# Patient Record
Sex: Female | Born: 1935 | Race: White | Hispanic: No | Smoking: Former smoker
Health system: Southern US, Community
[De-identification: ages and names within clinical notes are randomized; demographics above are authoritative.]

## PROBLEM LIST (undated history)

## (undated) DIAGNOSIS — K219 Gastro-esophageal reflux disease without esophagitis: Secondary | ICD-10-CM

## (undated) DIAGNOSIS — I10 Essential (primary) hypertension: Secondary | ICD-10-CM

## (undated) DIAGNOSIS — N182 Chronic kidney disease, stage 2 (mild): Secondary | ICD-10-CM

## (undated) DIAGNOSIS — M1990 Unspecified osteoarthritis, unspecified site: Secondary | ICD-10-CM

## (undated) DIAGNOSIS — J449 Chronic obstructive pulmonary disease, unspecified: Secondary | ICD-10-CM

## (undated) DIAGNOSIS — R202 Paresthesia of skin: Secondary | ICD-10-CM

## (undated) DIAGNOSIS — R0602 Shortness of breath: Secondary | ICD-10-CM

## (undated) DIAGNOSIS — G709 Myoneural disorder, unspecified: Secondary | ICD-10-CM

## (undated) HISTORY — PX: OTHER SURGICAL HISTORY: SHX169

## (undated) HISTORY — PX: JOINT REPLACEMENT: SHX530

---

## 2008-05-02 ENCOUNTER — Encounter: Admission: RE | Admit: 2008-05-02 | Discharge: 2008-05-02 | Payer: Self-pay | Admitting: Orthopaedic Surgery

## 2008-05-14 ENCOUNTER — Ambulatory Visit: Payer: Self-pay | Admitting: *Deleted

## 2008-05-14 ENCOUNTER — Ambulatory Visit (HOSPITAL_COMMUNITY): Admission: RE | Admit: 2008-05-14 | Discharge: 2008-05-14 | Payer: Self-pay | Admitting: Orthopaedic Surgery

## 2008-05-14 ENCOUNTER — Encounter (INDEPENDENT_AMBULATORY_CARE_PROVIDER_SITE_OTHER): Payer: Self-pay | Admitting: Orthopaedic Surgery

## 2008-10-14 ENCOUNTER — Inpatient Hospital Stay (HOSPITAL_COMMUNITY): Admission: RE | Admit: 2008-10-14 | Discharge: 2008-10-20 | Payer: Self-pay | Admitting: Orthopaedic Surgery

## 2008-10-31 ENCOUNTER — Inpatient Hospital Stay (HOSPITAL_COMMUNITY): Admission: RE | Admit: 2008-10-31 | Discharge: 2008-11-04 | Payer: Self-pay | Admitting: Orthopaedic Surgery

## 2008-11-27 ENCOUNTER — Encounter (INDEPENDENT_AMBULATORY_CARE_PROVIDER_SITE_OTHER): Payer: Self-pay | Admitting: Internal Medicine

## 2008-11-27 ENCOUNTER — Ambulatory Visit: Payer: Self-pay | Admitting: Infectious Disease

## 2008-11-27 ENCOUNTER — Inpatient Hospital Stay (HOSPITAL_COMMUNITY): Admission: EM | Admit: 2008-11-27 | Discharge: 2008-12-16 | Payer: Self-pay | Admitting: Emergency Medicine

## 2008-11-27 ENCOUNTER — Ambulatory Visit: Payer: Self-pay | Admitting: Internal Medicine

## 2008-12-12 ENCOUNTER — Encounter (INDEPENDENT_AMBULATORY_CARE_PROVIDER_SITE_OTHER): Payer: Self-pay | Admitting: Internal Medicine

## 2009-05-19 ENCOUNTER — Inpatient Hospital Stay (HOSPITAL_COMMUNITY): Admission: RE | Admit: 2009-05-19 | Discharge: 2009-05-21 | Payer: Self-pay | Admitting: Orthopaedic Surgery

## 2010-09-21 LAB — C-REACTIVE PROTEIN
CRP: 0.9 mg/dL — ABNORMAL HIGH (ref <0.6)
CRP: 2 mg/dL — ABNORMAL HIGH (ref <0.6)

## 2010-09-21 LAB — BASIC METABOLIC PANEL
BUN: 12 mg/dL (ref 6–23)
Calcium: 8.9 mg/dL (ref 8.4–10.5)
Chloride: 104 mEq/L (ref 96–112)
Chloride: 105 mEq/L (ref 96–112)
Creatinine, Ser: 0.74 mg/dL (ref 0.4–1.2)
Creatinine, Ser: 0.74 mg/dL (ref 0.4–1.2)
GFR calc non Af Amer: >60 mL/min (ref >60)
Glucose, Bld: 77 mg/dL (ref 70–99)
Sodium: 137 mEq/L (ref 135–145)

## 2010-09-21 LAB — CBC
RDW: 19.3 % — ABNORMAL HIGH (ref 11.5–15.5)
RDW: 19.4 % — ABNORMAL HIGH (ref 11.5–15.5)
WBC: 4.4 10*3/uL (ref 4.0–10.5)

## 2010-09-21 LAB — SEDIMENTATION RATE: Sed Rate: 22 mm/hr (ref 0–22)

## 2010-09-22 LAB — BASIC METABOLIC PANEL
BUN: 22 mg/dL (ref 6–23)
CO2: 26 mEq/L (ref 19–32)
Calcium: 9.3 mg/dL (ref 8.4–10.5)
Chloride: 99 mEq/L (ref 96–112)
Creatinine, Ser: 1.11 mg/dL (ref 0.4–1.2)
Glucose, Bld: 74 mg/dL (ref 70–99)
Potassium: 5.3 mEq/L — ABNORMAL HIGH (ref 3.5–5.1)
Sodium: 134 mEq/L — ABNORMAL LOW (ref 135–145)

## 2010-09-22 LAB — ABO/RH: ABO/RH(D): A POS

## 2010-09-22 LAB — TYPE AND SCREEN: Antibody Screen: NEGATIVE

## 2010-09-22 LAB — DIFFERENTIAL
Basophils Absolute: 0 10*3/uL (ref 0.0–0.1)
Basophils Relative: 1 % (ref 0–1)
Eosinophils Absolute: 0 10*3/uL (ref 0.0–0.7)
Monocytes Absolute: 0.4 10*3/uL (ref 0.1–1.0)
Monocytes Relative: 6 % (ref 3–12)
Neutrophils Relative %: 74 % (ref 43–77)

## 2010-09-22 LAB — URINALYSIS, ROUTINE W REFLEX MICROSCOPIC
Bilirubin Urine: NEGATIVE
Glucose, UA: NEGATIVE mg/dL
Ketones, ur: NEGATIVE mg/dL
pH: 6.5 (ref 5.0–8.0)

## 2010-09-22 LAB — CBC
HCT: 31.9 % — ABNORMAL LOW (ref 36.0–46.0)
Platelets: 281 10*3/uL (ref 150–400)
RDW: 18.5 % — ABNORMAL HIGH (ref 11.5–15.5)
WBC: 6.3 10*3/uL (ref 4.0–10.5)

## 2010-09-22 LAB — URINE MICROSCOPIC-ADD ON

## 2010-09-27 LAB — BASIC METABOLIC PANEL
BUN: 10 mg/dL (ref 6–23)
BUN: 11 mg/dL (ref 6–23)
BUN: 11 mg/dL (ref 6–23)
BUN: 11 mg/dL (ref 6–23)
BUN: 13 mg/dL (ref 6–23)
BUN: 4 mg/dL — ABNORMAL LOW (ref 6–23)
BUN: 6 mg/dL (ref 6–23)
BUN: 7 mg/dL (ref 6–23)
BUN: 22 mg/dL (ref 6–23)
BUN: 34 mg/dL — ABNORMAL HIGH (ref 6–23)
BUN: 47 mg/dL — ABNORMAL HIGH (ref 6–23)
CO2: 26 mEq/L (ref 19–32)
CO2: 26 mEq/L (ref 19–32)
CO2: 26 mEq/L (ref 19–32)
CO2: 32 mEq/L (ref 19–32)
CO2: 35 mEq/L — ABNORMAL HIGH (ref 19–32)
CO2: 20 mEq/L (ref 19–32)
CO2: 23 mEq/L (ref 19–32)
Calcium: 8 mg/dL — ABNORMAL LOW (ref 8.4–10.5)
Calcium: 8 mg/dL — ABNORMAL LOW (ref 8.4–10.5)
Calcium: 8.3 mg/dL — ABNORMAL LOW (ref 8.4–10.5)
Calcium: 9.1 mg/dL (ref 8.4–10.5)
Calcium: 8.5 mg/dL (ref 8.4–10.5)
Calcium: 8.5 mg/dL (ref 8.4–10.5)
Calcium: 9.1 mg/dL (ref 8.4–10.5)
Calcium: 9.1 mg/dL (ref 8.4–10.5)
Chloride: 102 mEq/L (ref 96–112)
Chloride: 104 mEq/L (ref 96–112)
Chloride: 107 mEq/L (ref 96–112)
Chloride: 107 mEq/L (ref 96–112)
Chloride: 108 mEq/L (ref 96–112)
Chloride: 108 mEq/L (ref 96–112)
Chloride: 109 mEq/L (ref 96–112)
Chloride: 98 mEq/L (ref 96–112)
Chloride: 111 mEq/L (ref 96–112)
Creatinine, Ser: 0.61 mg/dL (ref 0.4–1.2)
Creatinine, Ser: 0.61 mg/dL (ref 0.4–1.2)
Creatinine, Ser: 0.64 mg/dL (ref 0.4–1.2)
Creatinine, Ser: 0.67 mg/dL (ref 0.4–1.2)
Creatinine, Ser: 0.69 mg/dL (ref 0.4–1.2)
Creatinine, Ser: 0.75 mg/dL (ref 0.4–1.2)
Creatinine, Ser: 0.76 mg/dL (ref 0.4–1.2)
Creatinine, Ser: 0.79 mg/dL (ref 0.4–1.2)
Creatinine, Ser: 0.72 mg/dL (ref 0.4–1.2)
Creatinine, Ser: 0.79 mg/dL (ref 0.4–1.2)
Creatinine, Ser: 0.97 mg/dL (ref 0.4–1.2)
GFR calc Af Amer: >60 mL/min (ref >60)
GFR calc Af Amer: >60 mL/min (ref >60)
GFR calc Af Amer: >60 mL/min (ref >60)
GFR calc Af Amer: >60 mL/min (ref >60)
GFR calc Af Amer: >60 mL/min (ref >60)
GFR calc Af Amer: >60 mL/min (ref >60)
GFR calc Af Amer: 38 mL/min — ABNORMAL LOW (ref >60)
GFR calc Af Amer: >60 mL/min (ref >60)
GFR calc Af Amer: >60 mL/min (ref >60)
GFR calc Af Amer: >60 mL/min (ref >60)
GFR calc non Af Amer: >60 mL/min (ref >60)
GFR calc non Af Amer: >60 mL/min (ref >60)
GFR calc non Af Amer: >60 mL/min (ref >60)
GFR calc non Af Amer: >60 mL/min (ref >60)
GFR calc non Af Amer: >60 mL/min (ref >60)
GFR calc non Af Amer: >60 mL/min (ref >60)
GFR calc non Af Amer: >60 mL/min (ref >60)
GFR calc non Af Amer: >60 mL/min (ref >60)
GFR calc non Af Amer: >60 mL/min (ref >60)
GFR calc non Af Amer: 31 mL/min — ABNORMAL LOW (ref >60)
GFR calc non Af Amer: 55 mL/min — ABNORMAL LOW (ref >60)
Glucose, Bld: 115 mg/dL — ABNORMAL HIGH (ref 70–99)
Glucose, Bld: 116 mg/dL — ABNORMAL HIGH (ref 70–99)
Glucose, Bld: 143 mg/dL — ABNORMAL HIGH (ref 70–99)
Glucose, Bld: 86 mg/dL (ref 70–99)
Glucose, Bld: 86 mg/dL (ref 70–99)
Glucose, Bld: 91 mg/dL (ref 70–99)
Glucose, Bld: 92 mg/dL (ref 70–99)
Glucose, Bld: 74 mg/dL (ref 70–99)
Glucose, Bld: 88 mg/dL (ref 70–99)
Potassium: 3.4 mEq/L — ABNORMAL LOW (ref 3.5–5.1)
Potassium: 3.5 mEq/L (ref 3.5–5.1)
Potassium: 3.7 mEq/L (ref 3.5–5.1)
Potassium: 3.7 mEq/L (ref 3.5–5.1)
Potassium: 3.7 mEq/L (ref 3.5–5.1)
Potassium: 3.8 mEq/L (ref 3.5–5.1)
Potassium: 4.3 mEq/L (ref 3.5–5.1)
Potassium: 3.3 mEq/L — ABNORMAL LOW (ref 3.5–5.1)
Potassium: 4.2 mEq/L (ref 3.5–5.1)
Sodium: 137 mEq/L (ref 135–145)
Sodium: 137 mEq/L (ref 135–145)
Sodium: 138 mEq/L (ref 135–145)
Sodium: 139 mEq/L (ref 135–145)
Sodium: 131 mEq/L — ABNORMAL LOW (ref 135–145)
Sodium: 133 mEq/L — ABNORMAL LOW (ref 135–145)
Sodium: 138 mEq/L (ref 135–145)
Sodium: 139 mEq/L (ref 135–145)

## 2010-09-27 LAB — CBC
HCT: 27.7 % — ABNORMAL LOW (ref 36.0–46.0)
HCT: 27.9 % — ABNORMAL LOW (ref 36.0–46.0)
HCT: 29.4 % — ABNORMAL LOW (ref 36.0–46.0)
HCT: 30.2 % — ABNORMAL LOW (ref 36.0–46.0)
HCT: 30.6 % — ABNORMAL LOW (ref 36.0–46.0)
HCT: 30.7 % — ABNORMAL LOW (ref 36.0–46.0)
HCT: 31 % — ABNORMAL LOW (ref 36.0–46.0)
HCT: 32.2 % — ABNORMAL LOW (ref 36.0–46.0)
HCT: 32.6 % — ABNORMAL LOW (ref 36.0–46.0)
HCT: 32.7 % — ABNORMAL LOW (ref 36.0–46.0)
HCT: 33.8 % — ABNORMAL LOW (ref 36.0–46.0)
HCT: 34.7 % — ABNORMAL LOW (ref 36.0–46.0)
HCT: 33.5 % — ABNORMAL LOW (ref 36.0–46.0)
HCT: 34.8 % — ABNORMAL LOW (ref 36.0–46.0)
HCT: 29.3 % — ABNORMAL LOW (ref 36.0–46.0)
HCT: 31.9 % — ABNORMAL LOW (ref 36.0–46.0)
Hemoglobin: 10 g/dL — ABNORMAL LOW (ref 12.0–15.0)
Hemoglobin: 10.1 g/dL — ABNORMAL LOW (ref 12.0–15.0)
Hemoglobin: 10.1 g/dL — ABNORMAL LOW (ref 12.0–15.0)
Hemoglobin: 10.6 g/dL — ABNORMAL LOW (ref 12.0–15.0)
Hemoglobin: 11.1 g/dL — ABNORMAL LOW (ref 12.0–15.0)
Hemoglobin: 11.3 g/dL — ABNORMAL LOW (ref 12.0–15.0)
Hemoglobin: 9.8 g/dL — ABNORMAL LOW (ref 12.0–15.0)
Hemoglobin: 9.9 g/dL — ABNORMAL LOW (ref 12.0–15.0)
Hemoglobin: 11.8 g/dL — ABNORMAL LOW (ref 12.0–15.0)
Hemoglobin: 10.1 g/dL — ABNORMAL LOW (ref 12.0–15.0)
Hemoglobin: 10.4 g/dL — ABNORMAL LOW (ref 12.0–15.0)
Hemoglobin: 10.7 g/dL — ABNORMAL LOW (ref 12.0–15.0)
Hemoglobin: 9.8 g/dL — ABNORMAL LOW (ref 12.0–15.0)
MCHC: 31.6 g/dL (ref 30.0–36.0)
MCHC: 32.4 g/dL (ref 30.0–36.0)
MCHC: 32.6 g/dL (ref 30.0–36.0)
MCHC: 32.8 g/dL (ref 30.0–36.0)
MCHC: 32.8 g/dL (ref 30.0–36.0)
MCHC: 32.9 g/dL (ref 30.0–36.0)
MCHC: 33 g/dL (ref 30.0–36.0)
MCHC: 33 g/dL (ref 30.0–36.0)
MCHC: 32.7 g/dL (ref 30.0–36.0)
MCHC: 32.1 g/dL (ref 30.0–36.0)
MCHC: 32.3 g/dL (ref 30.0–36.0)
MCHC: 33.4 g/dL (ref 30.0–36.0)
MCV: 84.2 fL (ref 78.0–100.0)
MCV: 84.3 fL (ref 78.0–100.0)
MCV: 84.9 fL (ref 78.0–100.0)
MCV: 85 fL (ref 78.0–100.0)
MCV: 85 fL (ref 78.0–100.0)
MCV: 85.4 fL (ref 78.0–100.0)
MCV: 85.8 fL (ref 78.0–100.0)
MCV: 86 fL (ref 78.0–100.0)
MCV: 86.6 fL (ref 78.0–100.0)
MCV: 86.8 fL (ref 78.0–100.0)
MCV: 87.3 fL (ref 78.0–100.0)
MCV: 84.9 fL (ref 78.0–100.0)
MCV: 85.9 fL (ref 78.0–100.0)
MCV: 85.2 fL (ref 78.0–100.0)
Platelets: 368 10*3/uL (ref 150–400)
Platelets: 398 10*3/uL (ref 150–400)
Platelets: 437 10*3/uL — ABNORMAL HIGH (ref 150–400)
Platelets: 449 10*3/uL — ABNORMAL HIGH (ref 150–400)
Platelets: 488 10*3/uL — ABNORMAL HIGH (ref 150–400)
Platelets: 505 10*3/uL — ABNORMAL HIGH (ref 150–400)
Platelets: 552 10*3/uL — ABNORMAL HIGH (ref 150–400)
Platelets: 561 10*3/uL — ABNORMAL HIGH (ref 150–400)
Platelets: 587 10*3/uL — ABNORMAL HIGH (ref 150–400)
Platelets: 569 10*3/uL — ABNORMAL HIGH (ref 150–400)
Platelets: 421 10*3/uL — ABNORMAL HIGH (ref 150–400)
Platelets: 434 10*3/uL — ABNORMAL HIGH (ref 150–400)
Platelets: 510 10*3/uL — ABNORMAL HIGH (ref 150–400)
RBC: 3.31 MIL/uL — ABNORMAL LOW (ref 3.87–5.11)
RBC: 3.44 MIL/uL — ABNORMAL LOW (ref 3.87–5.11)
RBC: 3.5 MIL/uL — ABNORMAL LOW (ref 3.87–5.11)
RBC: 3.51 MIL/uL — ABNORMAL LOW (ref 3.87–5.11)
RBC: 3.57 MIL/uL — ABNORMAL LOW (ref 3.87–5.11)
RBC: 3.59 MIL/uL — ABNORMAL LOW (ref 3.87–5.11)
RBC: 3.69 MIL/uL — ABNORMAL LOW (ref 3.87–5.11)
RBC: 3.77 MIL/uL — ABNORMAL LOW (ref 3.87–5.11)
RBC: 3.82 MIL/uL — ABNORMAL LOW (ref 3.87–5.11)
RBC: 3.9 MIL/uL (ref 3.87–5.11)
RBC: 3.99 MIL/uL (ref 3.87–5.11)
RBC: 3.44 MIL/uL — ABNORMAL LOW (ref 3.87–5.11)
RBC: 3.5 MIL/uL — ABNORMAL LOW (ref 3.87–5.11)
RBC: 3.9 MIL/uL (ref 3.87–5.11)
RDW: 17.2 % — ABNORMAL HIGH (ref 11.5–15.5)
RDW: 18.2 % — ABNORMAL HIGH (ref 11.5–15.5)
RDW: 18.5 % — ABNORMAL HIGH (ref 11.5–15.5)
RDW: 18.7 % — ABNORMAL HIGH (ref 11.5–15.5)
RDW: 18.8 % — ABNORMAL HIGH (ref 11.5–15.5)
RDW: 18.9 % — ABNORMAL HIGH (ref 11.5–15.5)
RDW: 19.2 % — ABNORMAL HIGH (ref 11.5–15.5)
RDW: 19.5 % — ABNORMAL HIGH (ref 11.5–15.5)
RDW: 19.8 % — ABNORMAL HIGH (ref 11.5–15.5)
RDW: 16.9 % — ABNORMAL HIGH (ref 11.5–15.5)
RDW: 17.4 % — ABNORMAL HIGH (ref 11.5–15.5)
WBC: 13.5 10*3/uL — ABNORMAL HIGH (ref 4.0–10.5)
WBC: 13.8 10*3/uL — ABNORMAL HIGH (ref 4.0–10.5)
WBC: 15 10*3/uL — ABNORMAL HIGH (ref 4.0–10.5)
WBC: 15.9 10*3/uL — ABNORMAL HIGH (ref 4.0–10.5)
WBC: 16.3 10*3/uL — ABNORMAL HIGH (ref 4.0–10.5)
WBC: 16.7 10*3/uL — ABNORMAL HIGH (ref 4.0–10.5)
WBC: 17.6 10*3/uL — ABNORMAL HIGH (ref 4.0–10.5)
WBC: 18.6 10*3/uL — ABNORMAL HIGH (ref 4.0–10.5)
WBC: 21.3 10*3/uL — ABNORMAL HIGH (ref 4.0–10.5)
WBC: 9 10*3/uL (ref 4.0–10.5)
WBC: 9.7 10*3/uL (ref 4.0–10.5)
WBC: 9.9 10*3/uL (ref 4.0–10.5)
WBC: 55.3 10*3/uL (ref 4.0–10.5)
WBC: 61 10*3/uL (ref 4.0–10.5)
WBC: 21.7 10*3/uL — ABNORMAL HIGH (ref 4.0–10.5)
WBC: 23.5 10*3/uL — ABNORMAL HIGH (ref 4.0–10.5)
WBC: 41.8 10*3/uL — ABNORMAL HIGH (ref 4.0–10.5)

## 2010-09-27 LAB — COMPREHENSIVE METABOLIC PANEL
Albumin: 1.6 g/dL — ABNORMAL LOW (ref 3.5–5.2)
Albumin: 2.1 g/dL — ABNORMAL LOW (ref 3.5–5.2)
Alkaline Phosphatase: 136 U/L — ABNORMAL HIGH (ref 39–117)
BUN: 6 mg/dL (ref 6–23)
BUN: 57 mg/dL — ABNORMAL HIGH (ref 6–23)
BUN: 58 mg/dL — ABNORMAL HIGH (ref 6–23)
CO2: 21 mEq/L (ref 19–32)
Calcium: 8.3 mg/dL — ABNORMAL LOW (ref 8.4–10.5)
Chloride: 100 mEq/L (ref 96–112)
Chloride: 99 mEq/L (ref 96–112)
Creatinine, Ser: 0.59 mg/dL (ref 0.4–1.2)
Creatinine, Ser: 2.52 mg/dL — ABNORMAL HIGH (ref 0.4–1.2)
GFR calc non Af Amer: 18 mL/min — ABNORMAL LOW (ref >60)
GFR calc non Af Amer: 18 mL/min — ABNORMAL LOW (ref >60)
Glucose, Bld: 92 mg/dL (ref 70–99)
Glucose, Bld: 121 mg/dL — ABNORMAL HIGH (ref 70–99)
Potassium: 5.5 mEq/L — ABNORMAL HIGH (ref 3.5–5.1)
Total Bilirubin: 0.5 mg/dL (ref 0.3–1.2)
Total Bilirubin: 0.5 mg/dL (ref 0.3–1.2)
Total Bilirubin: 0.6 mg/dL (ref 0.3–1.2)
Total Protein: 4.4 g/dL — ABNORMAL LOW (ref 6.0–8.3)

## 2010-09-27 LAB — DIFFERENTIAL
Basophils Absolute: 0.1 10*3/uL (ref 0.0–0.1)
Basophils Absolute: 0 10*3/uL (ref 0.0–0.1)
Basophils Absolute: 0 10*3/uL (ref 0.0–0.1)
Basophils Absolute: 0 10*3/uL (ref 0.0–0.1)
Basophils Absolute: 0 10*3/uL (ref 0.0–0.1)
Basophils Relative: 0 % (ref 0–1)
Basophils Relative: 0 % (ref 0–1)
Basophils Relative: 0 % (ref 0–1)
Eosinophils Absolute: 0 10*3/uL (ref 0.0–0.7)
Eosinophils Absolute: 0 10*3/uL (ref 0.0–0.7)
Eosinophils Absolute: 0 10*3/uL (ref 0.0–0.7)
Eosinophils Absolute: 0 10*3/uL (ref 0.0–0.7)
Eosinophils Absolute: 0 10*3/uL (ref 0.0–0.7)
Eosinophils Relative: 0 % (ref 0–5)
Eosinophils Relative: 0 % (ref 0–5)
Lymphocytes Relative: 3 % — ABNORMAL LOW (ref 12–46)
Lymphocytes Relative: 3 % — ABNORMAL LOW (ref 12–46)
Lymphs Abs: 1.8 10*3/uL (ref 0.7–4.0)
Lymphs Abs: 2.4 10*3/uL (ref 0.7–4.0)
Lymphs Abs: 1.5 10*3/uL (ref 0.7–4.0)
Monocytes Absolute: 1.8 10*3/uL — ABNORMAL HIGH (ref 0.1–1.0)
Monocytes Absolute: 1.1 10*3/uL — ABNORMAL HIGH (ref 0.1–1.0)
Monocytes Absolute: 1.3 10*3/uL — ABNORMAL HIGH (ref 0.1–1.0)
Monocytes Relative: 3 % (ref 3–12)
Monocytes Relative: 4 % (ref 3–12)
Monocytes Relative: 6 % (ref 3–12)
Monocytes Relative: 8 % (ref 3–12)
Neutro Abs: 18.6 10*3/uL — ABNORMAL HIGH (ref 1.7–7.7)
Neutro Abs: 38.8 10*3/uL — ABNORMAL HIGH (ref 1.7–7.7)
Neutrophils Relative %: 86 % — ABNORMAL HIGH (ref 43–77)
Neutrophils Relative %: 86 % — ABNORMAL HIGH (ref 43–77)
Neutrophils Relative %: 91 % — ABNORMAL HIGH (ref 43–77)
Neutrophils Relative %: 94 % — ABNORMAL HIGH (ref 43–77)

## 2010-09-27 LAB — GASTRIC OCCULT BLOOD (1-CARD TO LAB)

## 2010-09-27 LAB — URINALYSIS, ROUTINE W REFLEX MICROSCOPIC
Glucose, UA: NEGATIVE mg/dL
Ketones, ur: 15 mg/dL — AB
Protein, ur: 30 mg/dL — AB

## 2010-09-27 LAB — CLOSTRIDIUM DIFFICILE EIA

## 2010-09-27 LAB — PHOSPHORUS
Phosphorus: 3.1 mg/dL (ref 2.3–4.6)
Phosphorus: 5 mg/dL — ABNORMAL HIGH (ref 2.3–4.6)

## 2010-09-27 LAB — VANCOMYCIN, TROUGH: Vancomycin Tr: 18.3 ug/mL (ref 10.0–20.0)

## 2010-09-27 LAB — GIARDIA/CRYPTOSPORIDIUM SCREEN(EIA)
Cryptosporidium Screen (EIA): NEGATIVE
Giardia Screen - EIA: NEGATIVE

## 2010-09-27 LAB — TSH: TSH: 3.161 u[IU]/mL (ref 0.350–4.500)

## 2010-09-27 LAB — CROSSMATCH

## 2010-09-27 LAB — CULTURE, BLOOD (ROUTINE X 2): Culture: NO GROWTH

## 2010-09-27 LAB — GLUCOSE, CAPILLARY: Glucose-Capillary: 124 mg/dL — ABNORMAL HIGH (ref 70–99)

## 2010-09-27 LAB — MAGNESIUM: Magnesium: 1.9 mg/dL (ref 1.5–2.5)

## 2010-09-27 LAB — URINE MICROSCOPIC-ADD ON

## 2010-09-27 LAB — HEMOGLOBIN AND HEMATOCRIT, BLOOD: HCT: 25.7 % — ABNORMAL LOW (ref 36.0–46.0)

## 2010-09-27 LAB — VANCOMYCIN, RANDOM: Vancomycin Rm: <5 ug/mL

## 2010-09-28 LAB — CBC
HCT: 29 % — ABNORMAL LOW (ref 36.0–46.0)
HCT: 29.1 % — ABNORMAL LOW (ref 36.0–46.0)
Hemoglobin: 9.5 g/dL — ABNORMAL LOW (ref 12.0–15.0)
MCHC: 32.7 g/dL (ref 30.0–36.0)
MCV: 85.8 fL (ref 78.0–100.0)
MCV: 86.2 fL (ref 78.0–100.0)
Platelets: 266 10*3/uL (ref 150–400)
Platelets: 368 10*3/uL (ref 150–400)
Platelets: 470 10*3/uL — ABNORMAL HIGH (ref 150–400)
RBC: 3.4 MIL/uL — ABNORMAL LOW (ref 3.87–5.11)
RBC: 3.91 MIL/uL (ref 3.87–5.11)
RDW: 15.9 % — ABNORMAL HIGH (ref 11.5–15.5)
RDW: 16.1 % — ABNORMAL HIGH (ref 11.5–15.5)
WBC: 5.9 10*3/uL (ref 4.0–10.5)
WBC: 6.7 10*3/uL (ref 4.0–10.5)
WBC: 6.8 10*3/uL (ref 4.0–10.5)

## 2010-09-28 LAB — PROTIME-INR
INR: 1.1 (ref 0.00–1.49)
Prothrombin Time: 14 seconds (ref 11.6–15.2)

## 2010-09-28 LAB — BASIC METABOLIC PANEL
BUN: 13 mg/dL (ref 6–23)
BUN: 17 mg/dL (ref 6–23)
Chloride: 95 mEq/L — ABNORMAL LOW (ref 96–112)
Creatinine, Ser: 0.65 mg/dL (ref 0.4–1.2)
Creatinine, Ser: 1.26 mg/dL — ABNORMAL HIGH (ref 0.4–1.2)
GFR calc Af Amer: 49 mL/min — ABNORMAL LOW (ref >60)
GFR calc non Af Amer: 41 mL/min — ABNORMAL LOW (ref >60)
GFR calc non Af Amer: >60 mL/min (ref >60)
Glucose, Bld: 85 mg/dL (ref 70–99)
Potassium: 3.8 mEq/L (ref 3.5–5.1)
Potassium: 4.1 mEq/L (ref 3.5–5.1)

## 2010-09-28 LAB — DIFFERENTIAL
Lymphocytes Relative: 29 % (ref 12–46)
Lymphs Abs: 1.9 10*3/uL (ref 0.7–4.0)
Monocytes Relative: 7 % (ref 3–12)
Neutrophils Relative %: 65 % (ref 43–77)

## 2010-09-28 LAB — SEDIMENTATION RATE: Sed Rate: 18 mm/hr (ref 0–22)

## 2010-09-28 LAB — C-REACTIVE PROTEIN: CRP: 3.2 mg/dL — ABNORMAL HIGH (ref <0.6)

## 2010-09-28 LAB — APTT: aPTT: 32 seconds (ref 24–37)

## 2010-09-29 LAB — BASIC METABOLIC PANEL WITH GFR
BUN: 11 mg/dL (ref 6–23)
CO2: 32 meq/L (ref 19–32)
Calcium: 8.8 mg/dL (ref 8.4–10.5)
Chloride: 92 meq/L — ABNORMAL LOW (ref 96–112)
Creatinine, Ser: 0.68 mg/dL (ref 0.4–1.2)
GFR calc non Af Amer: >60 mL/min
Glucose, Bld: 74 mg/dL (ref 70–99)
Potassium: 3.3 meq/L — ABNORMAL LOW (ref 3.5–5.1)
Sodium: 132 meq/L — ABNORMAL LOW (ref 135–145)

## 2010-09-29 LAB — ANAEROBIC CULTURE

## 2010-09-29 LAB — BODY FLUID CULTURE

## 2010-09-29 LAB — CBC
HCT: 34.8 % — ABNORMAL LOW (ref 36.0–46.0)
Hemoglobin: 10.1 g/dL — ABNORMAL LOW (ref 12.0–15.0)
Hemoglobin: 11.6 g/dL — ABNORMAL LOW (ref 12.0–15.0)
MCHC: 32.8 g/dL (ref 30.0–36.0)
MCHC: 33.2 g/dL (ref 30.0–36.0)
Platelets: 261 10*3/uL (ref 150–400)
Platelets: 355 10*3/uL (ref 150–400)
RBC: 3.56 MIL/uL — ABNORMAL LOW (ref 3.87–5.11)
RBC: 4.09 MIL/uL (ref 3.87–5.11)
RDW: 16.5 % — ABNORMAL HIGH (ref 11.5–15.5)
WBC: 7 10*3/uL (ref 4.0–10.5)

## 2010-09-29 LAB — BASIC METABOLIC PANEL
BUN: 10 mg/dL (ref 6–23)
BUN: 14 mg/dL (ref 6–23)
CO2: 32 mEq/L (ref 19–32)
Calcium: 8.9 mg/dL (ref 8.4–10.5)
Creatinine, Ser: 0.7 mg/dL (ref 0.4–1.2)
GFR calc Af Amer: >60 mL/min (ref >60)
GFR calc Af Amer: >60 mL/min (ref >60)
GFR calc non Af Amer: >60 mL/min (ref >60)
Potassium: 4.2 mEq/L (ref 3.5–5.1)
Sodium: 132 mEq/L — ABNORMAL LOW (ref 135–145)

## 2010-09-29 LAB — TISSUE CULTURE

## 2010-09-29 LAB — PROTIME-INR: Prothrombin Time: 14.1 seconds (ref 11.6–15.2)

## 2010-09-29 LAB — DIFFERENTIAL
Eosinophils Relative: 0 % (ref 0–5)
Lymphocytes Relative: 24 % (ref 12–46)
Lymphs Abs: 1.7 10*3/uL (ref 0.7–4.0)
Monocytes Relative: 7 % (ref 3–12)

## 2010-09-29 LAB — TYPE AND SCREEN
ABO/RH(D): A POS
Antibody Screen: NEGATIVE

## 2010-09-29 LAB — CK: Total CK: 49 U/L (ref 7–177)

## 2010-09-29 LAB — SEDIMENTATION RATE
Sed Rate: 16 mm/h (ref 0–22)
Sed Rate: 17 mm/hr (ref 0–22)

## 2010-09-29 LAB — C-REACTIVE PROTEIN: CRP: 7.9 mg/dL — ABNORMAL HIGH (ref <0.6)

## 2010-11-02 NOTE — Discharge Summary (Signed)
NAME:  Bridget Duncan, Bridget Duncan NO.:  [id number]   MEDICAL RECORD NO.:  [id number]          PATIENT TYPE:  INP   LOCATION:  5006                         FACILITY:  [facility]   PHYSICIAN:  Vanita Panda. Magnus Ivan, M.D.DATE OF BIRTH:  July 14, 1925   DATE OF ADMISSION:  10/14/2008  DATE OF DISCHARGE:  10/20/2008                               DISCHARGE SUMMARY   ADMITTING DIAGNOSIS:  Right hip prosthetic infection.   POSTOPERATIVE DIAGNOSIS:  Right hip prosthetic infection.   PROCEDURES:  Irrigation and debridement of right hip superficial and  deep tissue with placement of antibiotic beads on October 14, 2008.   HOSPITAL COURSE:  Ms. Waugh is an 77 year old female who has had  previous 8 surgeries involving her right hip.  She started out with a  hip fracture many years ago and this was treated all in [place],  [place].  According to the family,  she had a series of infections of right hip and even went around 16  months without any type of hardware in her hip.  She previously  underwent a total hip replacement about 4-5 years ago with a constrained  liner and a large stem.  She had been doing well until followup in my  office last week.  She was noted to have pain in her hip incision and  the area that I needed to open up because it was fluctuant.  I ended up  taking her to the operating room and found evidence of deep infection  and this did grow out methicillin-resistant staph aureus.  She did have  previous exposure to clindamycin and the organism is resistant to  clindamycin.  She had an allergy to vancomycin and it was recommended  that I switch her to Cubicin IV.  We also added Bactrim double strength.  During her hospital course, all her labs were normal except for her CRP  was elevated at 7.  Her hospital course was uneventful and she remained  afebrile with stable vital signs except for some drainage from her hip  wound.  A PICC  line was placed for delivery of IV antibiotics for the  next 6 weeks with hopes of suppressing this infection.  By the day of  discharge, I felt she could be discharged safely to home with Home  Health coming in for PICC line care and delivery of the antibiotics.   DISPOSITION:  Home.   DISCHARGE MEDICATIONS:  1. Cubicin 400 mg IV daily for 6 weeks.  2. Bactrim double strength 1 tablet p.o. b.i.d.  3. See medical reconciliation orders for her to start all her same      home medications that she was on prior to admission.   DISCHARGE INSTRUCTIONS:  While she is at home, she can get her incision  wet and dry dressing will be changed daily to right hip incision.  Followup will be established in my office in 2 weeks.     Vanita Panda. Magnus Ivan, M.D.  Electronically Signed    CYB/MEDQ  D:  10/20/2008  T:  10/20/2008  Job:  [id number]

## 2010-11-02 NOTE — Group Therapy Note (Signed)
NAME:  Bridget Duncan, Bridget Duncan NO.:  [id number]   MEDICAL RECORD NO.:  [id number]          PATIENT TYPE:  INP   LOCATION:  4712                         FACILITY:  [facility]   PHYSICIAN:  Altha Harm, MDDATE OF BIRTH:  1925/07/11                                 PROGRESS NOTE   INTERIM SUMMARY/PROGRESS NOTE:   DATE OF NOTE:  December 02, 2008.   DISCHARGE DIAGNOSES:  1. Profuse diarrhea.  2. Clostridium difficile colitis.  3. Dehydration, resolved.  4. Hyponatremia, resolved.  5. Hypokalemia, treated.  6. Emesis, resolved.  7. Acute renal failure, resolved.  8. 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  9. Generalized weakness.  10.History of ulcerative colitis.  11.History of osteoporosis and osteoarthritis.  12.History of chronic obstructive pulmonary disease.  13.History of glaucoma.   DISCHARGE MEDICATIONS:  Not yet determined.  These are to be determined by the physician at the  time of discharge.   CONSULTATIONS:  Vita Erm, M.D., Infectious Diseases.   PROCEDURE:  None.   LABORATORY/DIAGNOSTIC STUDIES:  1. CT scan abdomen and pelvis done in the emergency room on November 26, 2008 which shows pancolitis.  Differential diagnoses including      infection, ischemia, inflammatory bowel disease; #2 impression:      Ascites without definite free air to suggest perforation.  2. Abdominal x-ray, one view done on November 27, 2008 which shows no      evidence of bowel obstruction or free intraperitoneal air.      Centralized small bowel loops may be related to ascites.  3. Two view chest x-ray done on November 27, 2008 which shows no acute      cardiopulmonary disease.  Borderline cardiomegaly without      congestive heart failure.  Portable chest x-ray done on November 28, 2008 shows low lung volumes and left base atelectasis.  4. Abdominal x-ray, one view, done November 28, 2008 shows new gaseous      distention of the  stomach.   CODE STATUS:  Full code.   PRIMARY CARE PHYSICIAN:  Dr. Tresa Endo in [place], [place].   GASTROENTEROLOGIST:  [facility].   CHIEF COMPLAINT:  Diarrhea x1 week.   HISTORY OF PRESENT ILLNESS:  Please refer to the history and physical by Dr. Allena Katz for details of the  history of present illness.   HOSPITAL COURSE:  1. Clostridium difficile colitis.  The patient came in to the hospital      with profuse diarrhea, supported by a finding of pancolitis on a      CT.  The patient had been on suppressive antibiotics for an MRSA      infection of the right hip with oral vancomycin.  The patient had      also been on several course of antibiotics prior to that.  All this      taken into account, the patient was presumptively started on      treatment for Clostridium difficile colitis with Flagyl.  Incidentally, the patient had been seen by her gastroenterologist      in [facility] just a few days before and a stool sample was taken.      The family brought in the report on that date which confirmed the      diagnosis of Clostridium difficile.  Given the patient's age and      the degree of the elevation of her white blood cell count, which      was as high as 65,000, the patient was switched over to vancomycin.      An ID consultation was obtained.  Dr. Daiva Eves saw the patient and      agreed with vancomycin for this patient and increased the dose to      500 mg q.i.d.  The patient has now completed day 5 out of 14 days      of vancomycin.  Dr. Daiva Eves is in the process of making      recommendations for suppressive therapy, both for Clostridium      difficile and for the infection in her right hip, given the fact      that the patient will be on prolonged antibiotics for the      methicillin resistant Staphylococcus aureus infection in her right      hip.  I will defer to Dr. Daiva Eves and decisions should be      elucidated in the final discharge  summary as to antibiotics that      the patient will be taking on a long-term basis.  2. Dehydration.  The patient had been having profuse diarrhea with      poor oral intake in addition to emesis.  This rendered her in a      state of dehydration with acute renal failure and ensuing      hyponatremia.  The patient was aggressively rehydrated with 0.9      normal saline.  This treatment rectified her dehydration, her      hyponatremia and her acute renal failure.  The patient currently      has a normal renal function.  Her sodium is 139 and she is eating,      drinking and maintaining her fluid status without any difficulty.  3. Hypoxemia.  The patient was found to be hypoxic and required O2      support during her hospitalization.  It was felt that this might      have been secondary to over-correction of her fluid status.  The      patient is given a dose of Lasix and is being weaned off her oxygen      currently.  Her IV fluids will be discontinued as the patient is      now tolerating her diet without difficulty and her oral intake is      improving.  In terms of her generalized weakness, the patient has      had multiple procedures done to her right hip and is weight-bearing      as tolerated.  I have asked physical therapy to see the patient in      consultation and to initiate treatment.  The patient was receiving      physical therapy prior to her admission here at the hospital and      she is to continue do so until her functional status has improved      to its restorative manner.  4. Hypokalemia.  This  will be repleted orally and her electrolytes      followed up.   DISCUSSION:  Today, her white blood cell count is 19.6, down from high of 65,000.  The patient does have an anemia and her  anemia and her anemia is stable at 9.8.  Otherwise, the patient is  stable and this brings her up to date on her hospital course.  The  patient has received a dietary consult with  recommendations to follow.      Altha Harm, MD  Electronically Signed     MAM/MEDQ  D:  12/02/2008  T:  12/02/2008  Job:  [id number]

## 2010-11-02 NOTE — Discharge Summary (Signed)
NAME:  Bridget Duncan, GALEAS NO.:  [id number]   MEDICAL RECORD NO.:  [id number]          PATIENT TYPE:  INP   LOCATION:  5002                         FACILITY:  [facility]   PHYSICIAN:  Vanita Panda. Magnus Ivan, M.D.DATE OF BIRTH:  1926/01/11   DATE OF ADMISSION:  10/31/2008  DATE OF DISCHARGE:  11/04/2008                               DISCHARGE SUMMARY   ADMITTING DIAGNOSIS:  Chronic right prosthetic hip joint infection  status post irrigation and debridement and antibiotic bead placement.   DISCHARGE DIAGNOSIS:  Chronic right hip prosthetic joint infection  status post second irrigation and debridement with removal of antibiotic  beads.   PROCEDURE:  Irrigation and debridement of right hip joint and prosthetic  hip with removal of antibiotic beads.   HOSPITAL COURSE:  [name] is an 77 year old female with a acute on  chronic infection involving her right hip.  She was taken to the  operating room earlier 2-3 weeks ago where an irrigation and debridement  was performed of the right prosthetic hip joint and antibiotic beads  were placed.  She has been on IV antibiotics through a PICC line.  She  now returns at this point for removal of the antibiotic beads and with  repeat serial irrigation and debridement.   She was taken to the operating room on the day of admission where she  underwent a repeat irrigation and debridement of her right hip joint  with removal of the antibiotic beads.  Following surgery which she  tolerated well, she was admitted to orthopedic floor bed for IV  antibiotics.  She was switched to vancomycin due to the resistance to  other antibiotics of the infection.  We also kept her in the hospital to  see how the vancomycin would react to her body given the fact that she  had a listed previous vancomycin allergy.  After being hospitalized for  a few days, she tolerated the vancomycin well and it was felt that she  could be discharged back to home  with continued IV antibiotics through  PICC line consisting of vancomycin.   DISPOSITION:  To home.   DISCHARGE INSTRUCTIONS:  While she is at home, she will continue to  weight bear as tolerated on the right hip.  Dry dressing should be  applied daily to the right hip and followup will be obtained in my  office in 2 weeks.  She will continue on vancomycin 1 g IV q.24 h. with  dosing through [facility] as well.  The followup appointment  again will be established in my office in 2 weeks.   DISCHARGE MEDICATIONS:  1. She will continue all her same previous medications that she has      been on and this could be seen through the medical reconciliation      order.  2. Vancomycin 1 g IV q.24 h. for the next 4 weeks.      Vanita Panda. Magnus Ivan, M.D.  Electronically Signed     CYB/MEDQ  D:  11/21/2008  T:  11/22/2008  Job:  [id number]

## 2010-11-02 NOTE — H&P (Signed)
NAME:  HETAL, PROANO NO.:  [id number]   MEDICAL RECORD NO.:  [id number]          PATIENT TYPE:  INP   LOCATION:  4712                         FACILITY:  [facility]   PHYSICIAN:  Manus Gunning, MD      DATE OF BIRTH:  1925-11-13   DATE OF ADMISSION:  11/26/2008  DATE OF DISCHARGE:                              HISTORY & PHYSICAL   CHIEF COMPLAINT:  Diarrhea for one week.   HISTORY OF PRESENT ILLNESS:  Mrs. Huberty is a pleasant 77 year old  Caucasian female who was brought to the ED by her family due to  secondary complaints.  She was seen by her primary care physician today  at which time she was found to have elevated white blood cell count and  platelets on her current presentation.  Of note, she is being treated by  Dr. Magnus Ivan of orthopedic surgery for chronic right prosthetic hip  joint infection.  She has been on vancomycin 1 g daily as well as  Bactrim double strength one tablet daily as well.  Apparently the  patient has been on this antibiotic for a total of 5 weeks and over the  past one week has gradually developed diarrhea which has gradually  worsened.  She currently suffers from nausea, has decreased appetite,  complains of abdominal distention and mild abdominal discomfort.  She  has frequent, foul-smelling, semi-solid stools which she describes  approximately 10 times a day if not more and there is a concern about  Clostridium difficile colitis.  At the time of presentation her white  blood cell count was 61,000 with 93% polymorphs with Dohle bodies,  vacuolated  neutrophils, toxic granulation and a moderate amount of left  shift with greater than 5% metas and myelocytes with occasional  promyelocytes noted as well.  She has been afebrile but a CT scan of her  abdomen performed in the emergency department demonstrated pancolitis  with ascites and a free intraperitoneal fluid as well.  There is bony  demineralization as well.  Dr. Magnus Ivan has  already visited with the  patient and will continue to follow while the patient is in-house.   The patient denies headache.  Does complain of generalized weakness.  Does complain of fatigue.  No lightheadedness. No tinnitus, no vertigo.  No syncope, no presyncope.  No odynophagia or dysphasia.  No chest pain,  palpations, PND or orthopnea.  No shortness of breath, cough or  expectoration.  Also abdominal fullness, positive abdominal discomfort,  mild occasional abdominal tenderness, positive diarrhea, no  constipation, no dysuria or polyuria or hematuria.  No bright red blood  per rectum, no melenic stools.  There is no hematuria.  The patient has  chronic osteoarthritis and musculoskeletal complaints secondary to that.   PAST MEDICAL/SURGICAL HISTORY:  1. History of ulcerated colitis.  2. History of MRSA of the right hip.  3. History of right hip repair, ORIF.  4. Osteoporosis and osteoarthritis.  5. Chronic obstructive pulmonary disease.  6. Glaucoma.   ALLERGIES:  None.   SOCIAL HISTORY:  Quit tobacco 6 years ago, was 1 to 1-1/2 per day smoker  for 50 years.  No history of alcohol or illicit drug use.   FAMILY HISTORY:  Mother and father had no significant past medical  history as can be recalled by the patient.   HOME MEDICATIONS:  1. Oxycodone 5/325 mg three times a day as needed.  2. Methocarbamol 500 mg daily as needed.  3. Forteo one injection daily.  4. Fortical one spray per day, alternate nostrils.  5. Asacol 1200 mg three times per day.  6. Lipitor patch 1.3% daily.  7. Lidoderm patch 5% for 12 hours in a 24-hour period.  8. Advair 250/50 two times per day.  9. Proventil four times per day.  10.Spiriva one time per day.  11.Trusopt 2% three times per day.  12.Xalatan 125 mg once a day.  13.Vancomycin 1000 mL daily.  14.Bactrim 800 mg once daily.  15.Zofran 4 mg two to three times per day.  16.Canasa suppositories once daily.  17.Multivitamins.  18.Calcium  600 Vitamin D.  19.Over-the-counter Prilosec one per day.   REVIEW OF SYSTEMS:  A 14-point review of systems performed.  Pertinent  positives as described above.   PHYSICAL EXAMINATION:  VITAL SIGNS:  At the time of presentation,  temperature 97.4, heart rate 104, respiratory rate 26, blood pressure  106/72, oxygen saturation 92%.  GENERAL:  Well-nourished, well-developed lady, lying in bed comfortably  in no apparent distress.  Does appear to be fatigued and weak.  HEENT:  Normocephalic, atraumatic.  Dry oral mucosa.  No thrush,  erythema, or post nasal drip. Eyes:  Anicteric.  Extraocular muscles  intact.  Pupils equal, round and reactive to light and accommodation.  NECK:  Supple. Good range of motion.  No thyromegaly.  No carotid  bruits.  Neck veins appear flat.  CARDIOVASCULAR:  S1 and S2 normal.  Regular rate and rhythm.  No  murmurs, rubs or gallops.  RESPIRATORY:  Air entry bilaterally equal.  No rhonchi, rales or  wheezes.  Moderate air movement.  ABDOMEN:  Soft, distended.  No tenderness on examination.  No colon  sign.  No Turner sign.  No frank discoloration.  No hepatosplenomegaly  appreciated.  Positive bowel sounds, high-pitched.  EXTREMITIES:  No clubbing, cyanosis or edema.  Positive bilateral  dorsalis pedis.  CNS:  Alert, oriented x3.  Cranial nerves II-XII grossly intact.  Power,  sensation and reflexes bilaterally symmetrical.  SKIN:  No ulcerations or masses.  HEM/ONC:  No palpable lymphadenopathy, ecchymosis, bruising, or  petechiae.   LABORATORY DATA:  Lactic acid 1.1.  White blood cell count 61,000,  polymorphs 92%, hemoglobin 11.8, hematocrit 34.8, platelet count  589,000.  Urinalysis demonstrates many bacteria with hyaline casts, rare  squamous epithelial cells.  Urinalysis negative for nitrate, trace  leukocytes, negative for blood and there is moderate bilirubin.  Sodium  131, potassium 5.5, chloride 100, CO2 21, glucose 121, BUN 57,  creatinine  2.62, total bilirubin 0.5, alk phos 136, AST 25, ALT 30,  total protein 6.5, albumin 2.3, calcium 8.7.  CT scan of the abdomen and  pelvis as described above.   ASSESSMENT/PLAN:  1. Pan colitis.  This is most likely secondary to Clostridium      difficile.  At this time check stool cultures, sensitivity, stool      ova and parasites, fecal wbc's, fecal occult blood and stool for      Clostridium difficile x3.  Stop vancomycin, stop Bactrim.  Start      metronidazole 500 mg every 8 hours.  Follow  closely and repeat      abdominal KUB  in the morning.  2. Acute renal insufficiency.  Baseline creatinine is approximately      0.06.  Etiology of this is most likely multifactorial secondary to      volume depletion and Bactrim. At this time hold any offending      agents and start normal saline 100 mL an hour. Check urine sodium      and urine creatinine as well as urine eosinophils.  3. History of methicillin resistant Staphylococcus aureus of right      prosthetic hip joint.  At this time will have to hold vancomycin      and Bactrim.  Dr. Magnus Ivan to follow along with [place].  4. History of osteoarthritis.  Continue home medications.  5. Gastrointestinal and deep venous thrombosis prophylaxis.  Protonix      40 mg IV daily and sequential compression devices.   Of note, the patient is a do not resuscitate/do not intubate patient.  This is per her daughter, Keonna Raether, phone [phone] and her husband  whose name is Rex, phone number [phone].  Maragret is the health care  power-of-attorney and has documentation of the same as described above.      Manus Gunning, MD     SP/MEDQ  D:  11/27/2008  T:  11/27/2008  Job:  [id number]

## 2010-11-02 NOTE — Consult Note (Signed)
NAME:  Bridget Duncan, Bridget Duncan NO.:  [id number]   MEDICAL RECORD NO.:  [id number]          PATIENT TYPE:  INP   LOCATION:  4712                         FACILITY:  [facility]   PHYSICIAN:  Acey Lav, MD  DATE OF BIRTH:  03-Feb-1926   DATE OF CONSULTATION:  11/27/2008  DATE OF DISCHARGE:                                 CONSULTATION   REQUESTING PHYSICIAN:  Altha Harm, MD.   REASON FOR INFECTIOUS DISEASE CONSULTATION:  A patient with C. difficile  colitis which developed while being treated for prosthetic hip  infection.   HISTORY OF PRESENT ILLNESS:  Bridget Duncan is an 77 year old Caucasian  female with a past medical history significant for ulcerative colitis  who has had a prior prosthetic hip infection on the right with  methicillin resistant Staph aureus treated at an outside facility who  was brought into the hospital in April and underwent incision and  drainage of her hip site with cultures yielding methicillin resistant  Staph aureus.  She had antibiotic beads placed by Dr. Magnus Ivan on the  27th during the incision and debridement that was performed.  She was  then discharged on Cubicin intravenously for 6 weeks.  Of note, she had  had some apparently hives while being given intravenous vancomycin  several years ago.  However, in the interim the patient was readmitted  and had a repeat incision and drainage on the 14th.  She then was placed  on vancomycin actually and tolerated it quite well and has had no hives  whatsoever since then.  She was given a slower infusion (so I suspect  this may have been red man reaction although it is unusual to have hives  with it).  Nonetheless the patient was tolerating the vancomycin quite  fine.  Unfortunately in addition to the vancomycin which she was sent  home on with plans for 4 weeks of IV vancomycin she also started taking  Bactrim again.  She believes the Bactrim was prescribed for her hip  infection but  from my reading of the notes it does not seem this was the  intention.  I believe it was given to treat for a urinary tract  infection or potentially it might have been prescribed previously to try  and suppress her hip infection.  Nonetheless, she continued on  vancomycin but also on oral Bactrim.  Approximately 8 days ago she  developed abdominal pain and nausea and started having loose stools with  some blood tinge to them.  She saw her gastroenterologist who sent stool  studies off.  He thought her blood in the stools were more likely due to  hemorrhoids and not due to her colitis.  He did check a C. diff toxin.  In the interim the patient continued to have worsening diarrhea with  worsening leukocytosis.  She was admitted with a white count of only  65,000 to the hospitalist service today.  As described, her admission  labs are pertinent for a white blood cell count of 55,000 with absolute  neutrophil count of 51,000.  Her C. diff toxin checked at her  gastroenterologist's office was positive and the family has shown me  this.  She is in renal failure with creatinine that was up to 2.6 on  June 9 when she came into the emergency room.  It is now 2.5 this  morning and her urine is less than 10 (obviously acute prerenal  failure).  We were asked to help and assist in the management of this  patient who is being treated for a prosthetic hip infection that has  been recurrent with Staph aureus and who now has C. difficile colitis.   PAST MEDICAL HISTORY:  1. Ulcerative colitis.  2. 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  3. History of right hip fracture repair with ORIF.  4. Osteoporosis and osteoarthritis.  5. COPD.  6. Glaucoma.  7. The patient has also some dyspepsia for which she has been taking      Prilosec in the evening, 2 tablets in the evening she has been      taking.   PAST SURGICAL HISTORY:  As described above.   SOCIAL HISTORY:  She quit  tobacco 6 years ago.  Used to be smoking 1 to  1-1/2 packs per day for 50 years.  No alcohol or illicit drug use.  She  lives with family.   FAMILY HISTORY:  Mother and father with no significant family history.   ALLERGIES:  Other than her reaction to [facility] which she seems now to  be tolerating quite well she has no other known drug allergies.   CURRENT MEDICATIONS:  1. Flagyl 500 mg every 8 hours.  2. Zosyn 3.375 g which was given once for some reason.  3. Vancomycin per dose by pharmacy per protocol.   ADDITIONAL MEDICATIONS:  She is on albuterol, calcitonin, Trusopt,  calcium carbonate, Flora-Q, ipratropium, latanoprost, __________,  morphine sulfate, multivitamins, pantoprazole intravenously 40 mg daily,  Tylenol, Zofran and Percocet.   REVIEW OF SYSTEMS:  As described above in history of present illness,  otherwise 10 point review of systems is negative.   PHYSICAL EXAMINATION:  VITAL SIGNS:  Temperature maximum was 98.3 since  arrival, blood pressure is 111/55, pulse is 94, pulse ox 96% on 2 liters  nasal cannula.  Respirations of 20.  GENERAL:  Quite pleasant lady who is lying in her bed with a food tray  in front of her.  She is alert and oriented to person, to location, to  month and date.  HEENT:  She is normocephalic, atraumatic.  Pupils equal, round and  reactive to light.  Sclerae anicteric.  Oropharynx dry.  CARDIOVASCULAR:  Tachycardic but no murmurs, gallops or rubs heard.  LUNGS:  Clear to auscultation bilaterally without wheezing, rhonchi,  rales.  ABDOMEN:  Soft, not distended but diffusely tender to palpation.  LOWER EXTREMITIES:  With no edema.   LABORATORY DATA:  CBC with differential today is white blood cell count  of 61,000, hemoglobin 11.8, platelets of 589, absolute neutrophil count  of 56.8.  Comprehensive metabolic panel - sodium 129, potassium 4.8,  chloride 99, bicarb 20, BUN and creatinine 58 and 2.52, glucose 105,  alkaline phosphatase  130 but bilirubin and other transaminases normal,  albumin 2.1.  Lactic acid 1.1, magnesium 2.4.  Urine creatinine 113.  Urine sodium was less than 10.  Thyroid stimulating hormone 3.161.   MICROBIOLOGICAL DATA:  Culture from abscess from her right hip on October 14, 2008, grew methicillin resistant Staph aureus which was sensitive to  gentamicin, intermediately  sensitive to levofloxacin, resistant to  oxacillin, sensitive to rifampin, sensitive to Bactrim, sensitive to  vancomycin and tetracycline.   IMPRESSION:  This is an 77 year old Caucasian female with a past medical  history significant for ulcerative colitis, prosthetic hip infection  which have been recurrent who presents with C. diff (Clostridium  difficile) colitis which developed while she was taking vancomycin and  Bactrim.  1. Clostridium difficile colitis:  She meets criteria for severe C.      difficile colitis given her white blood cell.  This alone would      qualify her.  Additionally her age would qualify her for severe C.      diff.  In patients with severe C. diff there have been some studies      which have shown an advantage to vancomycin over Flagyl.  Therefore      I will give her vancomycin 500 mg orally four times daily.  I will      continue her IV Flagyl for the present as she is just starting on      therapy and will likely dispense with this in the next 24 hours if      she continues to do well.  She certainly needs vigorous IV fluid      hydration given her prerenal failure.  I would not have a problem      with probiotics as that will certainly not cause any harm although      there benefit is of question in patients with C. diff colitis.  I      would like to stop her intravenous proton pump inhibitor as proton      pump inhibitors themselves have been shown in some studies to risk      for C. diff colitis comparable with broad spectrum antibiotics.  I      would give her instead Pepcid as needed for  dyspepsia.  Certainly      C. diff colitis is going to be a continued risk for her because she      is going to need continued treatment for her septic hip joint and      she will likely need some suppressive antibiotics.  We will try to      give her as narrow a spectrum as possible for her prosthetic hip,      see below.  2. Prosthetic hip infection.  In the setting of acute c diff colitis I      have decided to hold further vancomycin therapy for the next 48      hours. I will likely reinstitute in in a few days. With a narrower      gram positive coverage and without septra on board (gram negatives      too) it would b  less likely in and of itself to cause C. diff      colitis but certainly it can.  I am hoping that her C. diff colitis      more likely came from the additional coverage of gram negatives      that was provided by her continued use of Bactrim.  She will need      to be followed quite closely and will need to be followed in our      infectious disease clinic for followup.  3. Ulcerative colitis appears not to be flaring as this point in time      although it certainly could confuse the picture  in the future.   Thank you for this fascinating infectious disease consultation.  Will  follow along closely.      Acey Lav, MD  Electronically Signed     CV/MEDQ  D:  11/27/2008  T:  11/27/2008  Job:  [id number]   cc:   Altha Harm, MD  Vanita Panda. Magnus Ivan, M.D.

## 2010-11-02 NOTE — Op Note (Signed)
NAME:  Bridget Duncan, Bridget Duncan NO.:  [id number]   MEDICAL RECORD NO.:  [id number]          PATIENT TYPE:  INP   LOCATION:  5002                         FACILITY:  [facility]   PHYSICIAN:  Vanita Panda. Magnus Ivan, M.D.DATE OF BIRTH:  Dec 01, 1925   DATE OF PROCEDURE:  10/31/2008  DATE OF DISCHARGE:                               OPERATIVE REPORT   PREOPERATIVE DIAGNOSIS:  Right chronic hip prosthetic infection.   POSTOPERATIVE DIAGNOSIS:  Right chronic hip prosthetic infection.   PROCEDURES:  1. Repeat irrigation and debridement of right hip wound and      prosthesis.  2. Removal of antibiotic beads.   SURGEON:  Vanita Panda. Magnus Ivan, MD   ANESTHESIA:  General.   ANTIBIOTICS:  Vancomycin 1 g.   BLOOD LOSS:  Minimal.   COMPLICATIONS:  None.   INDICATIONS:  In brief, [name] is an 77 year old female who 2-3  weeks ago was brought to the operating room for irrigation and  debridement of a right prosthetic hip infection.  She had had previous  infections in this hip and we are attempting to try to suppress her  infection, so as a means of retaining the hardware hopefully.  This may  have been an acute infection for her because she did have an UTI.  The  cultures did grow methicillin-resistant Staphylococcus aureus.  This is  what she has had in the past as well.  She has been on IV Cubicin as  antibiotic.   DESCRIPTION OF PROCEDURE:  After informed consent was obtained and  appropriate right hip was marked, she was brought to the operating room  and placed supine on the operating room table.  General anesthesia was  obtained.  She was then turned into lateral decubitus position with a  right hip up.  Hip positioners were placed as well as the axillary roll.  The right hip was prepped and draped with DuraPrep and sterile drapes.  A time-out was called and she was identified as the correct patient and  correct right hip.  I then removed her previous sutures and  dissected  down through the soft tissue down to the infection site.  There was  still murky fluid in this area.  I removed the antibiotic beads and then  used 3 L of normal saline solution followed by 1 L of bacitracin  solution.  Then another liter of normal saline solution to irrigate  through the wound using pulsatile lavage.  I then placed a deep Hemovac  drain and closed the deep tissue with #1 Vicryl followed by 0 Vicryl, 2-  0 Vicryl, and 2-0 nylons on the  skin.  The patient was then turned in supine position, awakened,  extubated and taken to recovery room in stable condition.  Postoperatively, we will admit her to the floor and see about potential  Infectious Disease consult for adjusting her medications.      Vanita Panda. Magnus Ivan, M.D.  Electronically Signed     CYB/MEDQ  D:  10/31/2008  T:  11/01/2008  Job:  [id number]

## 2010-11-02 NOTE — Discharge Summary (Signed)
NAME:  Bridget Duncan, TITZER NO.:  [id number]   MEDICAL RECORD NO.:  [id number]          PATIENT TYPE:  INP   LOCATION:  5040                         FACILITY:  [facility]   PHYSICIAN:  Beckey Rutter, MD  DATE OF BIRTH:  1926-04-21   DATE OF ADMISSION:  11/26/2008  DATE OF DISCHARGE:                               DISCHARGE SUMMARY   Please amend this discharge summary to the previously dictated interim  discharge summary as a progress note on December 02, 2008.  For the last  couple of weeks, the following issues transpired:  1. Patient was continued on medication for C. diff colitis.      Nevertheless, it was noticed she has coffee-ground vomiting with      drop of her H and H.  For that purpose, Gastroenterology was      consulted and the patient underwent upper endoscopy.  The result      was indicating esophagitis and arteriovenous malformation which was      ablated.  There was no further workup recommended as per the      Gastroenterology.  The patient's H and H remained stable.  2. C. diff.  The patient concluded her vancomycin and Flagyl      medication and she will be continued only on doxycycline for her      chronic hip infection for suppression purposes.  3. Generalized weakness and severe deconditioning.  The patient will      be discharged to skilled nursing facility for rehab purposes.   Her white blood count has stabilized from 65,000 on the day of admission  to 9.1 on December 14, 2008.   DISCHARGE MEDICATIONS:  1. Ventolin/albuterol MDI inhaler every 4 hours p.r.n.  2. Calcitonin salmon 1 spray alternating nostril daily.  3. Calcium carbonate/Os-Cal 1 tablet p.o. daily.  4. Questran 4 g p.o. to be mixed with 4 to 6 ounces of liquid.      Suggested to take other medication 1 hour before or 4 to 6 hours      after the Questran.  5. Trusopt OP b.i.d.  6. Doxycycline 100 mg p.o. b.i.d.  7. Ensure t.i.d.  8. Flora-Q 1 capsule p.o. t.i.d.  9. Lasix 20 mg p.o.  b.i.d.  10.Atrovent MDI inhaler every 6 hours.  11.Xalatan ophthalmic eyedrops OP q.h.s.  12.Mesalamine 1200 mg p.o. t.i.d.  13.Multivitamins daily.  14.Neomycin/bacitracin/polymyxin topical cream to apply topically      b.i.d.  15.Protonix 40 mg p.o. in the morning.  16.Potassium chloride 40 mEq p.o. daily.  This medicine should not be      continued more than 10 days without checking renal function test.  17.Carafate 1 g p.o. q.i.d.  18.Tylenol 650 mg p.o. every 4 hours p.r.n.   DISCHARGE PLAN:  Patient is approaching stability.  Currently, she will  be discharged to [facility]  The patient is stable for  discharge.      Beckey Rutter, MD  Electronically Signed     EME/MEDQ  D:  12/15/2008  T:  12/15/2008  Job:  [id number]

## 2010-11-02 NOTE — Op Note (Signed)
NAME:  Bridget Duncan, Bridget Duncan NO.:  [id number]   MEDICAL RECORD NO.:  [id number]          PATIENT TYPE:  INP   LOCATION:  5006                         FACILITY:  [facility]   PHYSICIAN:  Vanita Panda. Magnus Ivan, M.D.DATE OF BIRTH:  06-Aug-1925   DATE OF PROCEDURE:  10/14/2008  DATE OF DISCHARGE:                               OPERATIVE REPORT   PREOPERATIVE DIAGNOSIS:  Questionable right prosthetic hip infection.   POSTOPERATIVE DIAGNOSIS:  Questionable right prosthetic hip infection.   PROCEDURE:  1. Irrigation and debridement of superficial and deep tissues, right      hip.  2. Irrigation debridement of right hip and joint.  3. Placement of antibiotic beads of right hip joint.   SURGEON:  Doneen Poisson, MD   ANESTHESIA:  General.   ANTIBIOTICS:  600 mg IV clindamycin after cultures obtained.   BLOOD LOSS:  200 mL.   FINDINGS:  Questionable purulent material deep around the right hip  joint.  No necrotic tissue.   COMPLICATIONS:  None.   INDICATIONS:  Briefly, Bridget Duncan is an 77 year old female who has  had no less than 8 surgeries involving her right hip.  He started off  with the hip fracture many years ago and this was all treated in  [place], [place].  According to the family, she did have a  series of infections in her hip and even went around 16 months without  any type of hardware in her hip.  She eventually underwent a total hip  replacement about 4 years to 5 years ago with a constrained liner and a  large stem.  She had been actually doing well with that hip and I will  follow her for other chronic problems including chronically deformed  lumbar spine.  The hip had not been giving her significant problems  until lately.  She came to my office yesterday with a small wound that  had developed in the middle aspect of the incision on her right hip.  It  was painful to touch and did feel fluctuant.  I made a small incision  over this in  the office and did appear to have necrotic tissue.  I did  not put her on antibiotics.  I cleaned the wound and put dressing on it  and then set her up for surgery for today for more thorough irrigation  and debridement of this area.  The family told me that she had evidence  of two urinary tract infections since the first of this year and even  had about pneumonia and had been on and off antibiotics.   PROCEDURE IN DETAIL:  After informed consent was obtained, appropriate  right hip was marked.  Bridget Duncan was brought to the operating room,  placed supine on the operating table.  General anesthesia was then  obtained.  She was then turned to lateral decubitus position with  appropriate padding down on operative left leg and an axillary roll was  placed as well as hip positioners were placed and the right hip was  prepped and draped with DuraPrep and sterile drapes including sterile  stockinette.  A time-out was called to identify the correct patient and  correct right hip.  I then went to the previous wound that I made  yesterday in the office and found necrotic tissue and the superficial  tissues.  I did then cultures of this.  I then extended my incision  proximally and distally and then cleaned the superficial tissue of any  necrotic tissue.  I then thoroughly irrigated this with normal saline  solution using pulsatile lavage.  Next, I extended my incision deep and  as I penetrated the hip joint, I did find an abundant amount of yellow  colored fluid.  This was a thick fluid as well.  Cultures were sent from  this deep region.  I then opened this up fully and did not find any  necrotic tissue, and I thoroughly suctioned out the hip joint.  She was  then given 6-10 mg of clindamycin IV.  I then used 3 liters of normal  saline solution using a pulsatile lavage to completely irrigate out the  hip joint and surrounding tissues.  I then used 1 L of bacitracin  solution.  Following this,  I then irrigated with another 3 L of normal  saline solution.  I then mixed bone cement with tobramycin and was able  to place around 9 antibiotic beads on PDS suture into the deep tissue.  I then closed the deep tissue with a #1 PDS followed by 2-0 PDS in the  more superficial tissue and interrupted 2-0 Vicryl subcutaneous tissue  and interrupted 2-0 nylon sutures on the skin.  The patient was then  rolled to the supine position, awakened, extubated, and taken to the  recovery room in stable condition after sterile dressings were applied  as well.  All final counts were correct and there were no complications  noted.  Postoperatively, I hope to treat this as a more acute infection  with 6 weeks of IV antibiotics followed by removal of antibiotic beads  and repeat irrigation and debridement.  Infectious Disease consult  obtained and we have talked with the family at length about the  possibility of trying to suppress this infection given her complications  in the past.      Vanita Panda. Magnus Ivan, M.D.  Electronically Signed     CYB/MEDQ  D:  10/14/2008  T:  10/15/2008  Job:  [id number]

## 2010-11-02 NOTE — Consult Note (Signed)
NAME:  Bridget Duncan, Bridget Duncan NO.:  [id number]   MEDICAL RECORD NO.:  [id number]          PATIENT TYPE:  INP   LOCATION:  2304                         FACILITY:  [facility]   PHYSICIAN:  Jordan Hawks. Elnoria Howard, MD    DATE OF BIRTH:  1925-10-17   DATE OF CONSULTATION:  12/07/2008  DATE OF DISCHARGE:                                 CONSULTATION   ORTHOPEDIC SURGEON:  Vanita Panda. Magnus Ivan, MD   REASON FOR CONSULTATION:  Coffee-ground emesis and drop in her  hemoglobin.   HISTORY OF PRESENT ILLNESS:  This is an 77 year old female that was  initially admitted to the hospital for diarrhea.  The patient was  treated for her persistent right hip infection, and subsequently she was  on long-term antibiotics.  During the course of her antibiotics, she  subsequently developed a significant amount diarrhea.  Her white blood  cell count had increased up to 61,000 and was felt that she had C diff  colitis.  She was admitted to the hospital and treated aggressively, and  subsequently her symptoms of diarrhea did improve.  In fact, the patient  was to be discharged out today but then she had acute nausea and  vomiting with coffee-ground emesis.  The patient denies any prior  history of peptic ulcer disease, gastroesophageal reflux disease,  nausea, or vomiting.  Her hemoglobin was noted to be mildly dropping  over the course of her hospitalization at this time.  She had undergone  a 1 L lavage and subsequently that has cleared.   Past medical history and past surgical history is as stated above with:  1. History of ulcerative colitis.  2. MRSA of the right hip.  3. Osteoporosis.  4. Osteoarthritis.  5. COPD.  6. Glaucoma.   ALLERGIES:  No known drug allergies.   SOCIAL HISTORY:  Prior tobacco use 6 years ago where she smoked one to  one-half packs per day for 50 years.  No alcohol or illicit drug use.   Family history is noncontributory.   REVIEW OF SYSTEMS:  As stated above in  history present illness,  otherwise, negative.   CURRENT MEDICATIONS:  Zofran, morphine sulfate, Atrovent, albuterol,  vancomycin, Protonix, Trusopt, albuterol, calcitonin, Atrovent, Flagyl,  and Xalatan.   PHYSICAL EXAMINATION:  VITAL SIGNS:  Blood pressure is 102/50, heart  rate is 98, pulse ox is 96%, respiratory rate is 18.  GENERAL:  The patient is in no acute distress, alert and oriented.  HEENT:  Normocephalic, atraumatic.  Extraocular muscles intact.  NECK:  Supple.  No lymphadenopathy.  LUNGS:  Clear to auscultation bilaterally.  CARDIOVASCULAR:  Regular rate and rhythm.  ABDOMEN:  Flat, soft, nontender, nondistended.  EXTREMITIES:  No clubbing, cyanosis, or edema.   LABORATORY VALUES:  White blood cell count 16.7, hemoglobin 8.1 which  has decreased from 9.2, platelets are 561.  Sodium 131, potassium 2.5,  chloride 102, CO2 of 26, glucose 143, BUN 13, creatinine 0.7.   IMPRESSION:  1. Coffee-ground emesis.  2. Decrease in her hemoglobin.  3. Clostridium difficile colitis.  4. Chronic right hip infection.  5. Other medical problems.  At this time, further evaluation will be required for her coffee-ground  emesis.  The NG lavage was performed and it cleared easily with 1 L.  I  am certain she just may have Mallory-Weiss tear or just some mild  bleeding from the anticoagulation.  Further recommendations will be made  pending the findings of the EGD.      Jordan Hawks Elnoria Howard, MD  Electronically Signed     PDH/MEDQ  D:  12/07/2008  T:  12/08/2008  Job:  [id number]   cc:   Vanita Panda. Magnus Ivan, M.D.

## 2010-11-02 NOTE — Discharge Summary (Signed)
NAME:  Bridget, Duncan NO.:  [id number]   MEDICAL RECORD NO.:  [id number]          PATIENT TYPE:  INP   LOCATION:  5002                         FACILITY:  [facility]   PHYSICIAN:  Vanita Panda. 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   PROCEDURES:  1. Irrigation and debridement of right prosthetic hip joint and      removal of antibiotic beads on Oct 31, 2008.  2. Intravenous antibiotics.   HOSPITAL COURSE:  Ms. Bridget Duncan is an 77 year old female with chronic  infection involving her right total hip prosthesis.  Several weeks ago,  she underwent irrigation and debridement of this hip joint and  antibiotics beads were placed with hopes of retaining the hardware.  She  has been on Cubicin IV as well as double strength Bactrim.  She was  readmitted on Oct 31, 2008, due to copious drainage from her right hip  in the ID that they will need to go ahead and remove the antibiotic  beads earlier than planned.   She was taken to the operating room on day of admission where she  underwent a thorough irrigation and debridement of the hip joint with  retention of hardware and removal of the antibiotic beads.  Then, I  admitted her back to regular floor bed.  Of note, this has been a  methicillin-resistant Staph aureus infection and is now resistant to  clindamycin.  I started on vancomycin, although in the past, she has had  an allergy to vancomycin, which was described as high as about 3 days  after being on vancomycin.  The organism appeared highly susceptible to  vancomycin, so I put her on this and then kept her in the hospital for  observation to make sure  that no adverse event occurred.  After 3-4 days  of being on vancomycin, there was no adverse event and it was felt that  she could be discharged safely to home on IV vancomycin for another 4  weeks.  On the day of discharge, her incision was clean, dry, and  intact.  She was doing well.   DISPOSITION:  To home.   DISCHARGE INSTRUCTIONS:  While she is at home, home health nursing will  come into keep providing PICC line care and vancomycin will be dosed at  1 g q.24 h. and adjusted per vanc trough levels and a basic metabolic  profile will be drawn biweekly.  Follow up appointment will be  established in my office in 2 weeks.  She will continue to ambulate as  tolerated.   DISCHARGE MEDICATIONS:  Vancomycin 1 g IV q.24 h. p.r.n.  See home  medical reconciliation orders for all the medications that she will be  continuing at home except for the Cubicin.      Vanita Panda. Magnus Ivan, M.D.  Electronically Signed  CYB/MEDQ  D:  11/04/2008  T:  11/04/2008  Job:  [id number]

## 2011-03-28 IMAGING — CR DG CHEST 2V
3 series · 3 of 3 positions shown · non-contrast
Comparison: None.

CLINICAL DATA: Preop hip surgery.

CHEST - 2 VIEW

[view not recorded (1 of 3)]
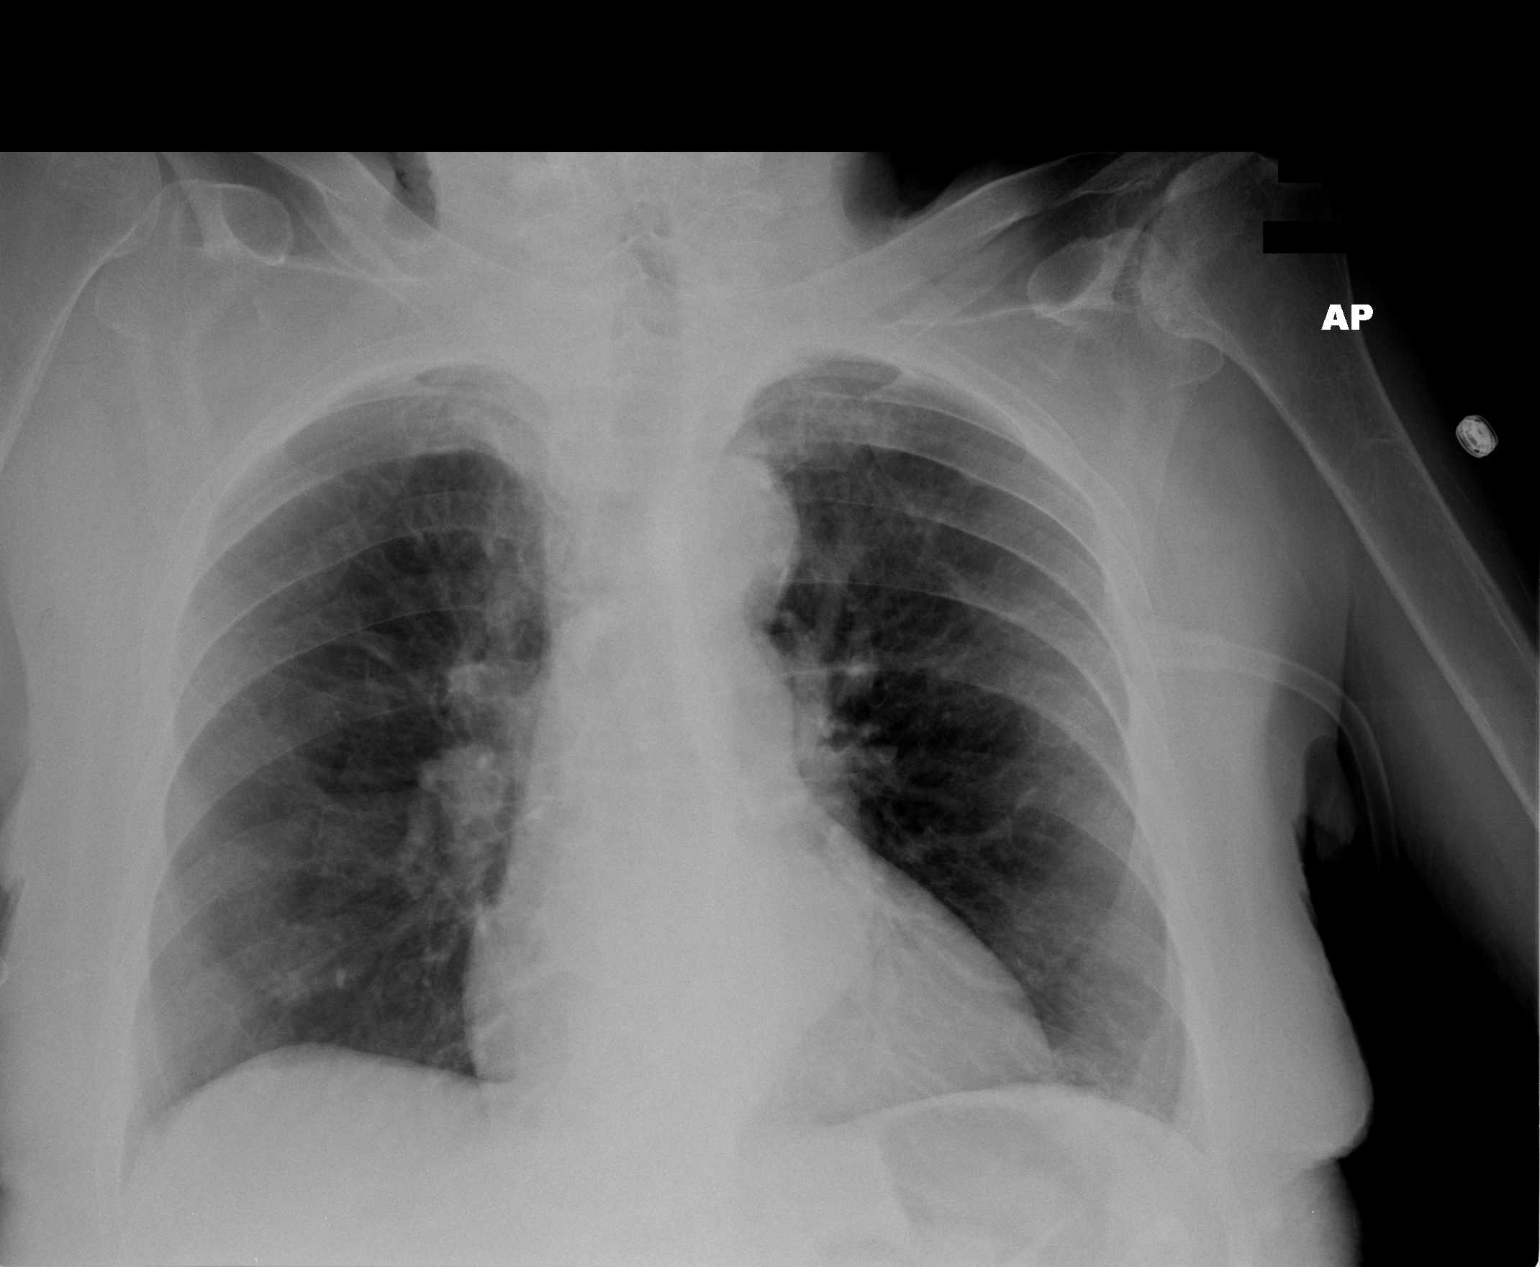

[view not recorded (2 of 3)]
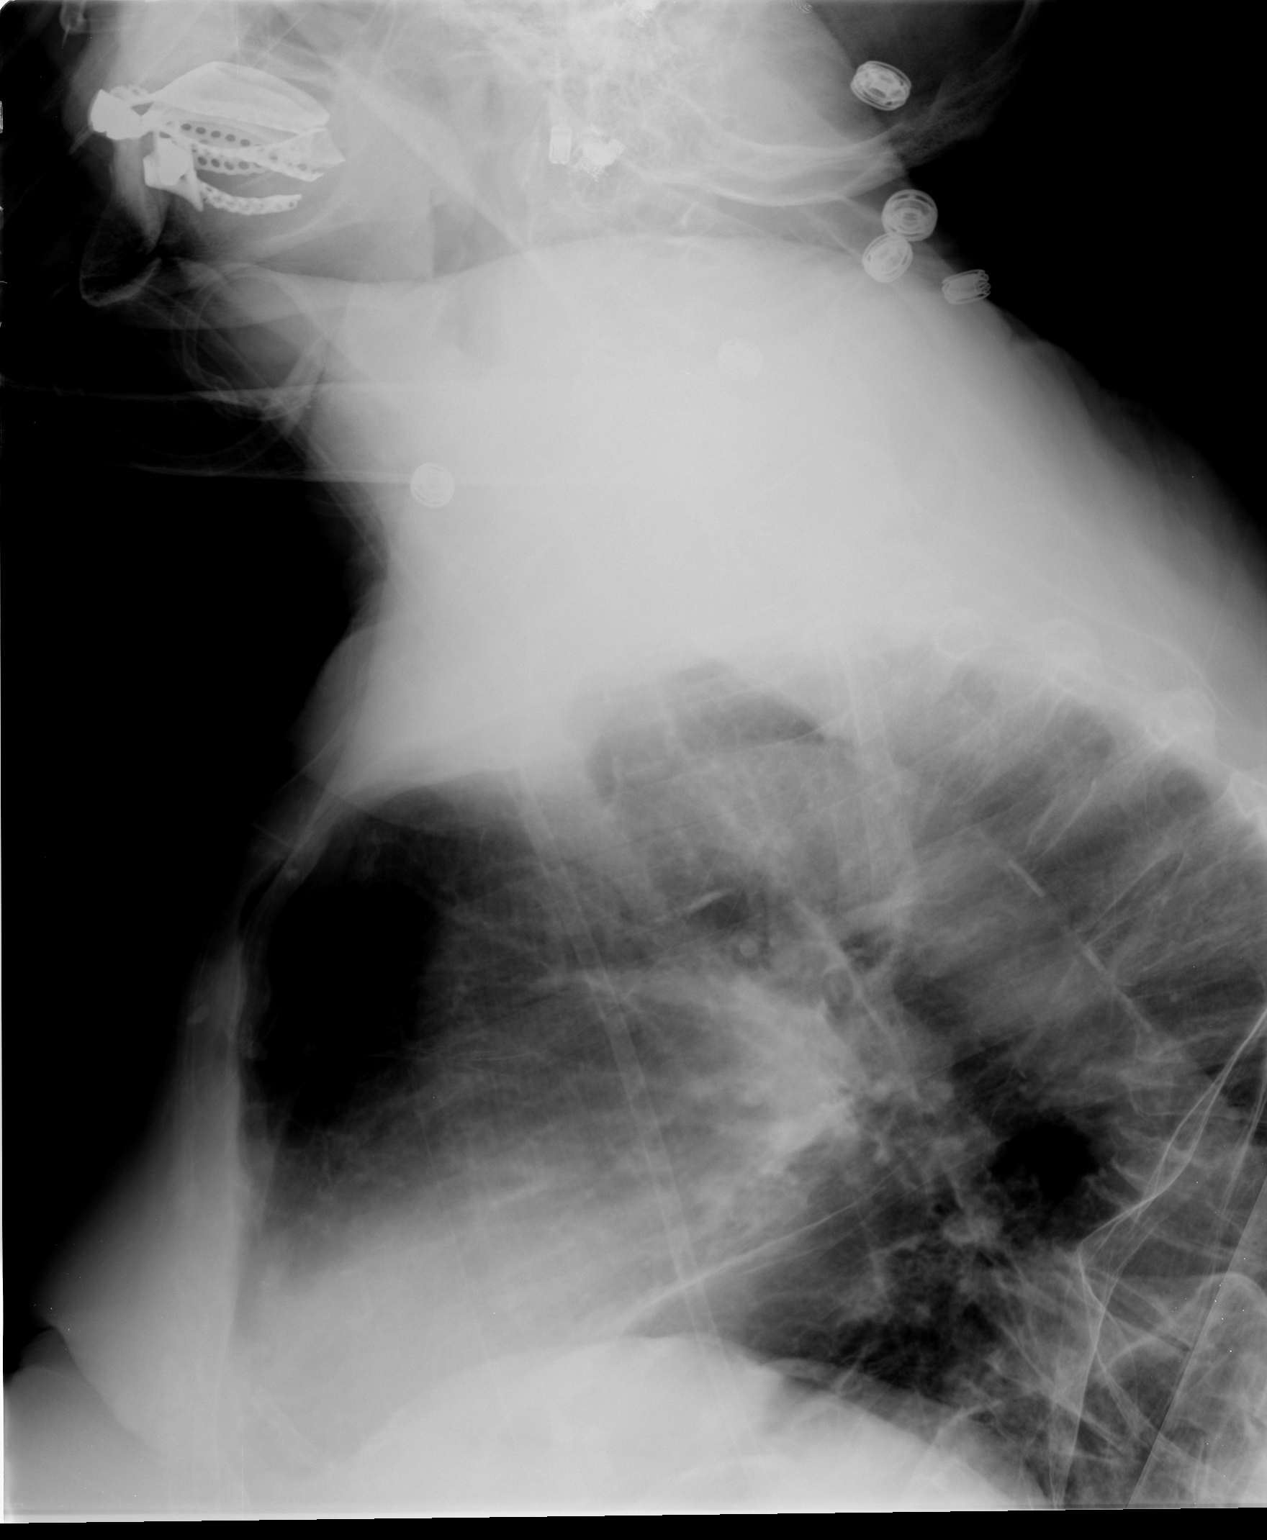

[view not recorded (3 of 3)]
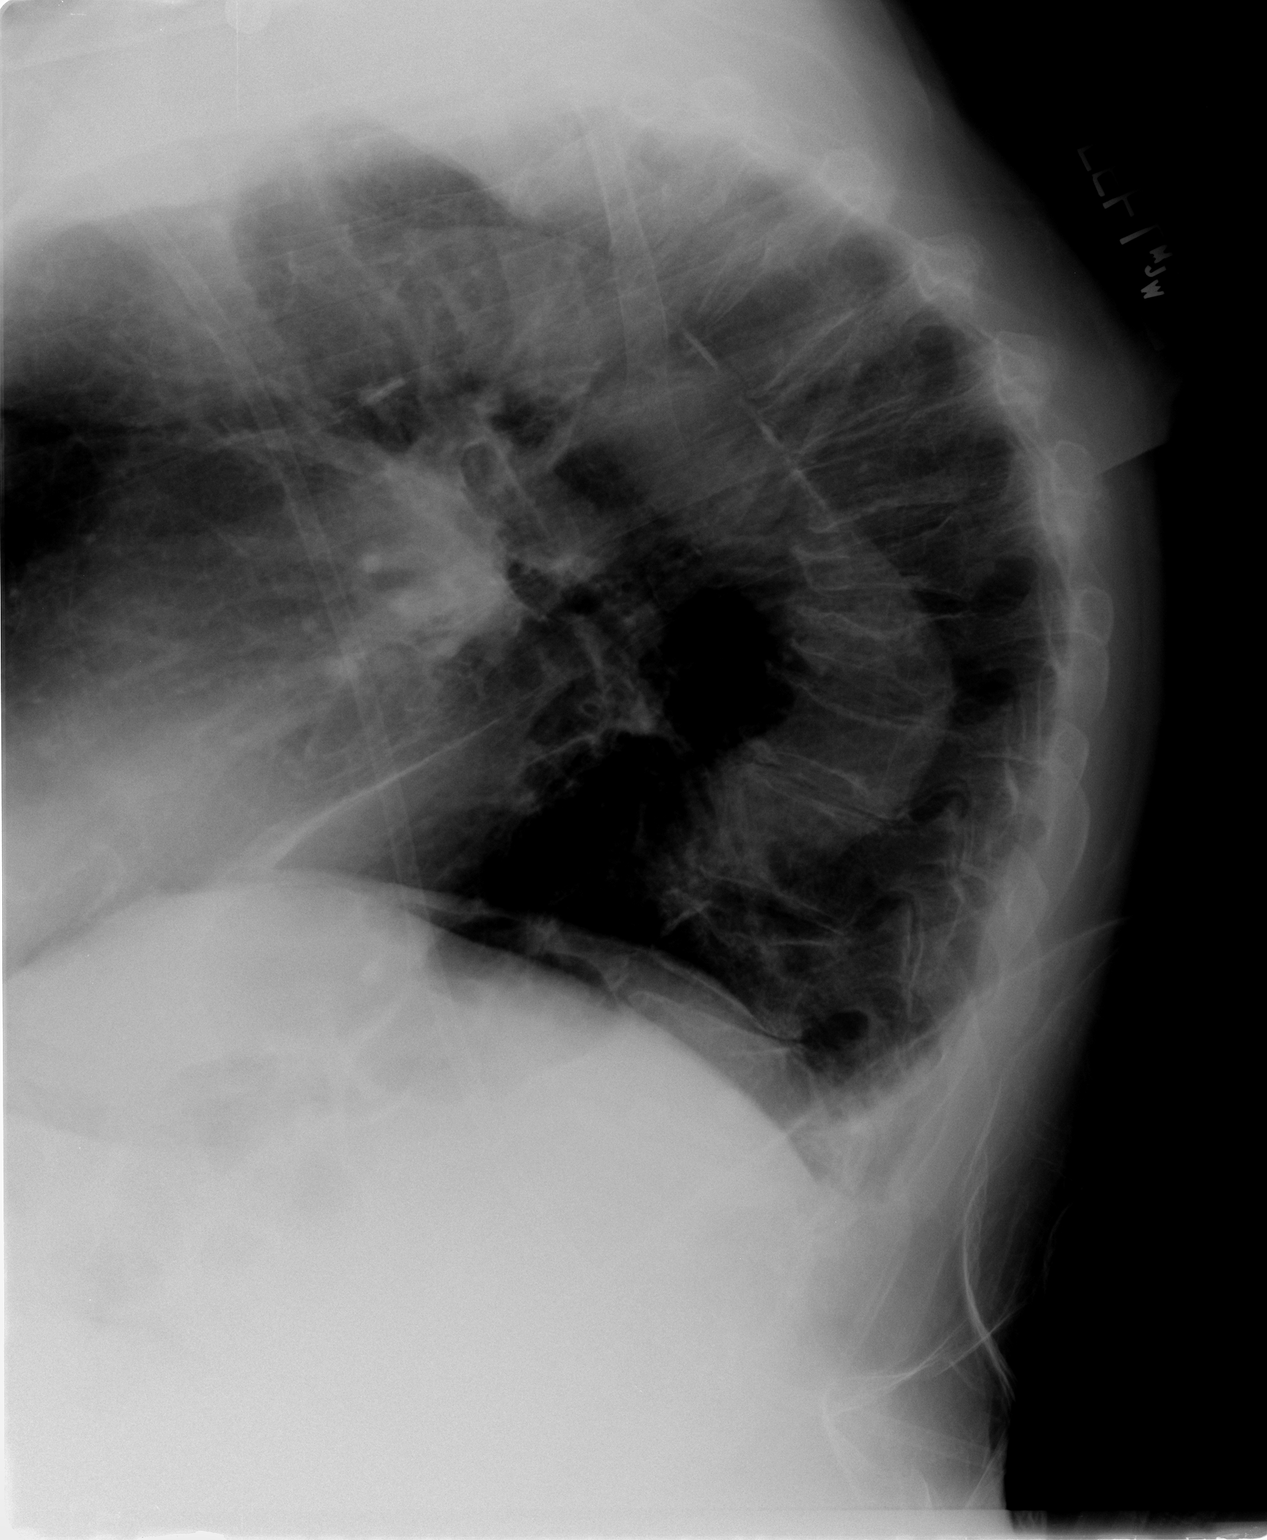

[3 of 3 positions shown; findings below may reference images not displayed]

FINDINGS: Heart size is normal and there is no heart failure.
There is no acute infiltrate or effusion.

Multiple thoracic compression fractures are noted of indeterminate
age.  There is increased AP diameter of  the chest and there is
dorsal kyphosis.
IMPRESSION: No active cardiopulmonary disease.

## 2011-04-30 ENCOUNTER — Emergency Department (HOSPITAL_COMMUNITY)
Admission: EM | Admit: 2011-04-30 | Discharge: 2011-04-30 | Disposition: A | Discharge status: Home / Self Care | Payer: Medicare Other | Attending: Emergency Medicine | Admitting: Emergency Medicine

## 2011-04-30 ENCOUNTER — Emergency Department (HOSPITAL_COMMUNITY): Payer: Medicare Other

## 2011-04-30 ENCOUNTER — Encounter: Payer: Self-pay | Admitting: *Deleted

## 2011-04-30 DIAGNOSIS — M542 Cervicalgia: Secondary | ICD-10-CM | POA: Insufficient documentation

## 2011-04-30 DIAGNOSIS — S1093XA Contusion of unspecified part of neck, initial encounter: Secondary | ICD-10-CM | POA: Insufficient documentation

## 2011-04-30 DIAGNOSIS — N182 Chronic kidney disease, stage 2 (mild): Secondary | ICD-10-CM | POA: Insufficient documentation

## 2011-04-30 DIAGNOSIS — T1490XA Injury, unspecified, initial encounter: Secondary | ICD-10-CM | POA: Insufficient documentation

## 2011-04-30 DIAGNOSIS — I129 Hypertensive chronic kidney disease with stage 1 through stage 4 chronic kidney disease, or unspecified chronic kidney disease: Secondary | ICD-10-CM | POA: Insufficient documentation

## 2011-04-30 DIAGNOSIS — T148XXA Other injury of unspecified body region, initial encounter: Secondary | ICD-10-CM

## 2011-04-30 DIAGNOSIS — IMO0002 Reserved for concepts with insufficient information to code with codable children: Secondary | ICD-10-CM | POA: Insufficient documentation

## 2011-04-30 DIAGNOSIS — W1789XA Other fall from one level to another, initial encounter: Secondary | ICD-10-CM | POA: Insufficient documentation

## 2011-04-30 DIAGNOSIS — S0003XA Contusion of scalp, initial encounter: Secondary | ICD-10-CM | POA: Insufficient documentation

## 2011-04-30 DIAGNOSIS — W19XXXA Unspecified fall, initial encounter: Secondary | ICD-10-CM

## 2011-04-30 HISTORY — DX: Paresthesia of skin: R20.2

## 2011-04-30 HISTORY — DX: Chronic kidney disease, stage 2 (mild): N18.2

## 2011-04-30 HISTORY — DX: Essential (primary) hypertension: I10

## 2011-04-30 HISTORY — DX: Unspecified osteoarthritis, unspecified site: M19.90

## 2011-04-30 HISTORY — DX: Chronic obstructive pulmonary disease, unspecified: J44.9

## 2011-04-30 MED ORDER — HYDROCODONE-ACETAMINOPHEN 5-325 MG PO TABS
1.0000 | ORAL_TABLET | Freq: Once | ORAL | Status: AC
  Administered 2011-04-30: 1 via ORAL
  Filled 2011-04-30: qty 1

## 2011-04-30 MED ORDER — BACITRACIN ZINC 500 UNIT/GM EX OINT
TOPICAL_OINTMENT | CUTANEOUS | Status: AC
  Administered 2011-04-30: 16:00:00 via TOPICAL
  Filled 2011-04-30: qty 0.9

## 2011-04-30 NOTE — ED Notes (Signed)
EMS called to home.  Found patient laying in flower bed after fall from porch Estimated fall 1.5'.  No deformity noted.  Bleeding to right hand controlled.  Pain between shoulder blades.  Patient does not have a deformity.  Denies  Any LOC

## 2011-04-30 NOTE — ED Notes (Signed)
Hand dressed with bacitracin and bandage.  Pt. Stood with no dizziness or dificulty.

## 2011-04-30 NOTE — ED Notes (Signed)
ZOX:[id number]<AV> Expected date:04/30/11<BR> Expected time:12:52 PM<BR> Means of arrival:Ambulance<BR> Comments:<BR> EMS 51 GC . 77 y/o female fall. Pt has hematoma on head, upper back pain. Vitals WNL

## 2011-04-30 NOTE — ED Provider Notes (Signed)
History     CSN: [id number] Arrival date & time: 04/30/2011  1:51 PM   First MD Initiated Contact with Patient 04/30/11 1401      Chief Complaint  Patient presents with  . Fall    LSB    (Consider location/radiation/quality/duration/timing/severity/associated sxs/prior treatment) HPI Comments: [place] was sitting on the porch misplaced the legs of her walker and when she stood fell backwards into the garden striking the back of her head pain is loss of consciousness, dizziness, numbness to her extremities.Does have small superficial abrasion to dorsum or R hand   Patient is a 77 y.o. female presenting with fall. The history is provided by the patient and a relative.  Fall The accident occurred less than 1 hour ago. The fall occurred while walking. She fell from a height of 1 to 2 ft. She landed on grass. The point of impact was the head. Pertinent negatives include no numbness.    Past Medical History  Diagnosis Date  . Arthritis   . COPD (chronic obstructive pulmonary disease)   . Osteoporosis   . Hypertension   . 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 Paresthesia     History reviewed. No pertinent past surgical history.  History reviewed. No pertinent family history.  History  Substance Use Topics  . Smoking status: Never Smoker   . Smokeless tobacco: Not on file  . Alcohol Use: No    OB History    Grav Para Term Preterm Abortions TAB SAB Ect Mult Living                  Review of Systems  Constitutional: Negative for activity change.  HENT: Positive for neck pain. Negative for neck stiffness.   Eyes: Negative.   Respiratory: Negative.   Cardiovascular: Negative.   Gastrointestinal: Negative.   Genitourinary: Negative.   Musculoskeletal: Positive for arthralgias.  Neurological: Negative for dizziness and numbness.  Hematological: Negative.   Psychiatric/Behavioral: Negative.     Allergies  Vancomycin  Home Medications    Current Outpatient Rx  Name Route Sig Dispense Refill  . ACETAMINOPHEN 500 MG PO TABS Oral Take 500 mg by mouth every 6 (six) hours as needed.      . ALBUTEROL SULFATE HFA 108 (90 BASE) MCG/ACT IN AERS Inhalation Inhale 2 puffs into the lungs every 6 (six) hours as needed.      . ARFORMOTEROL TARTRATE 15 MCG/2ML IN NEBU Nebulization Take 15 mcg by nebulization 2 (two) times daily.      . ATORVASTATIN CALCIUM 10 MG PO TABS Oral Take 10 mg by mouth daily.      Marland Kitchen CALCIUM CARBONATE-VITAMIN D 500-200 MG-UNIT PO TABS Oral Take 1 tablet by mouth daily.      Marland Kitchen DICLOFENAC EPOLAMINE 1.3 % TD PTCH Transdermal Place 1 patch onto the skin 2 (two) times daily.      Marland Kitchen DICLOFENAC SODIUM 1 % TD GEL Topical Apply topically.      Marland Kitchen HYDROCODONE-ACETAMINOPHEN 5-500 MG PO TABS Oral Take 1 tablet by mouth every 6 (six) hours as needed.      Marland Kitchen LATANOPROST 0.005 % OP SOLN  1 drop at bedtime.      Marland Kitchen LIDOCAINE 5 % EX PTCH Transdermal Place 1 patch onto the skin daily. Remove & Discard patch within 12 hours or as directed by MD     . MELOXICAM 15 MG PO TABS Oral Take 15 mg by mouth daily.      Marland Kitchen  MESALAMINE 400 MG PO TBEC Oral Take 400 mg by mouth 3 (three) times daily.      Marland Kitchen METHOCARBAMOL 500 MG PO TABS Oral Take 500 mg by mouth 4 (four) times daily.      Marland Kitchen PANTOPRAZOLE SODIUM 20 MG PO TBEC Oral Take 20 mg by mouth daily.      Marland Kitchen RALOXIFENE HCL 60 MG PO TABS Oral Take 60 mg by mouth daily.      . ROFLUMILAST 500 MCG PO TABS Oral Take 500 mcg by mouth daily.      Marland Kitchen ZOLPIDEM TARTRATE 10 MG PO TABS Oral Take 10 mg by mouth at bedtime as needed.        BP 169/77  Pulse 81  Temp(Src) 98.7 F (37.1 C) (Oral)  Resp 20  SpO2 96%  Physical Exam  Constitutional: She appears well-developed.  HENT:  Head:         Tender over hematoma L posterior occipital area   Neck:         bilateral upper C Spine tender   Cardiovascular: Normal rate.   Pulmonary/Chest: Effort normal.  Abdominal: Soft.  Musculoskeletal:        Normal ROM for this patient with multiple limitations due to severe RA  Neurological: She is alert.  Skin: Skin is warm and dry.       Small superficial abrasion to dorsum of R hand   Psychiatric: She has a normal mood and affect.    ED Course  Procedures (including critical care time)  Labs Reviewed - No data to display Ct Head Wo Contrast  04/30/2011  *RADIOLOGY REPORT*  Clinical Data:  Fall, pain between shoulder blades  CT HEAD WITHOUT CONTRAST CT CERVICAL SPINE WITHOUT CONTRAST  Technique:  Multidetector CT imaging of the head and cervical spine was performed following the standard protocol without intravenous contrast.  Multiplanar CT image reconstructions of the cervical spine were also generated.  Comparison:  None  CT HEAD  Findings: No evidence of parenchymal hemorrhage or extra-axial fluid collection. No mass lesion, mass effect, or midline shift.  No CT evidence of acute infarction.  Extensive subcortical white matter and periventricular small vessel ischemic changes.  Global cortical atrophy without ventriculomegaly.  The visualized paranasal sinuses are essentially clear. The mastoid air cells are unopacified.  No evidence of calvarial fracture.  IMPRESSION: No evidence of acute intracranial abnormality.  Atrophy with extensive small vessel ischemic changes  CT CERVICAL SPINE  Findings: Exaggerated cervical lordosis.  No evidence of fracture or dislocation.  The dens appears intact. The vertebral body heights are maintained.  No prevertebral soft tissue swelling.  Moderate multilevel degenerative changes.  The visualized left thyroid gland is heterogeneous/nodular.  The visualized lung apices are essentially clear.  IMPRESSION: No evidence of traumatic injury to the cervical spine.  Moderate multilevel degenerative changes  Original Report Authenticated By: Charline Bills, M.D.   Ct Cervical Spine Wo Contrast  04/30/2011  *RADIOLOGY REPORT*  Clinical Data:  Fall, pain between  shoulder blades  CT HEAD WITHOUT CONTRAST CT CERVICAL SPINE WITHOUT CONTRAST  Technique:  Multidetector CT imaging of the head and cervical spine was performed following the standard protocol without intravenous contrast.  Multiplanar CT image reconstructions of the cervical spine were also generated.  Comparison:  None  CT HEAD  Findings: No evidence of parenchymal hemorrhage or extra-axial fluid collection. No mass lesion, mass effect, or midline shift.  No CT evidence of acute infarction.  Extensive subcortical white matter and  periventricular small vessel ischemic changes.  Global cortical atrophy without ventriculomegaly.  The visualized paranasal sinuses are essentially clear. The mastoid air cells are unopacified.  No evidence of calvarial fracture.  IMPRESSION: No evidence of acute intracranial abnormality.  Atrophy with extensive small vessel ischemic changes  CT CERVICAL SPINE  Findings: Exaggerated cervical lordosis.  No evidence of fracture or dislocation.  The dens appears intact. The vertebral body heights are maintained.  No prevertebral soft tissue swelling.  Moderate multilevel degenerative changes.  The visualized left thyroid gland is heterogeneous/nodular.  The visualized lung apices are essentially clear.  IMPRESSION: No evidence of traumatic injury to the cervical spine.  Moderate multilevel degenerative changes  Original Report Authenticated By: Charline Bills, M.D.     No diagnosis found.    MDM  Due to the nature of the fall and age of this patient.  Will CT head and neck to evaluate for fractures or intracranial hemophage   3:32 PM  After review of CTScans and reexamination of patient will DC home     Arman Filter, NP 04/30/11 1533

## 2011-05-01 NOTE — ED Provider Notes (Signed)
Medical screening examination/treatment/procedure(s) were performed by non-physician practitioner and as supervising physician I was immediately available for consultation/collaboration.  Gregroy Dombkowski T Olie Dibert, MD 05/01/11 0728 

## 2011-08-05 ENCOUNTER — Encounter (HOSPITAL_COMMUNITY): Payer: Self-pay

## 2011-08-05 ENCOUNTER — Inpatient Hospital Stay (HOSPITAL_COMMUNITY)
Admission: AD | Admit: 2011-08-05 | Discharge: 2011-08-11 | DRG: 498 | Disposition: A | Discharge status: Home / Self Care | Payer: Medicare Other | Source: Ambulatory Visit | Attending: Orthopaedic Surgery | Admitting: Orthopaedic Surgery

## 2011-08-05 DIAGNOSIS — Z87891 Personal history of nicotine dependence: Secondary | ICD-10-CM

## 2011-08-05 DIAGNOSIS — Z79899 Other long term (current) drug therapy: Secondary | ICD-10-CM

## 2011-08-05 DIAGNOSIS — M810 Age-related osteoporosis without current pathological fracture: Secondary | ICD-10-CM | POA: Diagnosis present

## 2011-08-05 DIAGNOSIS — I129 Hypertensive chronic kidney disease with stage 1 through stage 4 chronic kidney disease, or unspecified chronic kidney disease: Secondary | ICD-10-CM | POA: Diagnosis present

## 2011-08-05 DIAGNOSIS — J449 Chronic obstructive pulmonary disease, unspecified: Secondary | ICD-10-CM | POA: Diagnosis present

## 2011-08-05 DIAGNOSIS — Z96649 Presence of unspecified artificial hip joint: Secondary | ICD-10-CM

## 2011-08-05 DIAGNOSIS — T8451XA Infection and inflammatory reaction due to internal right hip prosthesis, initial encounter: Secondary | ICD-10-CM

## 2011-08-05 DIAGNOSIS — T8450XA Infection and inflammatory reaction due to unspecified internal joint prosthesis, initial encounter: Principal | ICD-10-CM | POA: Diagnosis present

## 2011-08-05 DIAGNOSIS — N182 Chronic kidney disease, stage 2 (mild): Secondary | ICD-10-CM | POA: Diagnosis present

## 2011-08-05 DIAGNOSIS — Y831 Surgical operation with implant of artificial internal device as the cause of abnormal reaction of the patient, or of later complication, without mention of misadventure at the time of the procedure: Secondary | ICD-10-CM | POA: Diagnosis present

## 2011-08-05 DIAGNOSIS — R64 Cachexia: Secondary | ICD-10-CM | POA: Diagnosis present

## 2011-08-05 DIAGNOSIS — J4489 Other specified chronic obstructive pulmonary disease: Secondary | ICD-10-CM | POA: Diagnosis present

## 2011-08-05 DIAGNOSIS — Y92009 Unspecified place in unspecified non-institutional (private) residence as the place of occurrence of the external cause: Secondary | ICD-10-CM

## 2011-08-05 DIAGNOSIS — K219 Gastro-esophageal reflux disease without esophagitis: Secondary | ICD-10-CM | POA: Diagnosis present

## 2011-08-05 HISTORY — DX: Gastro-esophageal reflux disease without esophagitis: K21.9

## 2011-08-05 HISTORY — DX: Shortness of breath: R06.02

## 2011-08-05 LAB — CBC
HCT: 28.8 % — ABNORMAL LOW (ref 36.0–46.0)
Hemoglobin: 9.3 g/dL — ABNORMAL LOW (ref 12.0–15.0)
MCH: 30 pg (ref 26.0–34.0)
MCHC: 32.3 g/dL (ref 30.0–36.0)

## 2011-08-05 LAB — BASIC METABOLIC PANEL
BUN: 18 mg/dL (ref 6–23)
CO2: 30 mEq/L (ref 19–32)
GFR calc non Af Amer: 47 mL/min — ABNORMAL LOW (ref >90)
Glucose, Bld: 104 mg/dL — ABNORMAL HIGH (ref 70–99)
Potassium: 4.5 mEq/L (ref 3.5–5.1)

## 2011-08-05 LAB — MRSA PCR SCREENING: MRSA by PCR: NEGATIVE

## 2011-08-05 MED ORDER — MESALAMINE 400 MG PO TBEC
800.0000 mg | DELAYED_RELEASE_TABLET | Freq: Two times a day (BID) | ORAL | Status: DC
  Administered 2011-08-05 – 2011-08-11 (×12): 800 mg via ORAL
  Filled 2011-08-05 (×14): qty 2

## 2011-08-05 MED ORDER — ARFORMOTEROL TARTRATE 15 MCG/2ML IN NEBU
15.0000 ug | INHALATION_SOLUTION | Freq: Two times a day (BID) | RESPIRATORY_TRACT | Status: DC
  Administered 2011-08-05 – 2011-08-10 (×10): 15 ug via RESPIRATORY_TRACT
  Filled 2011-08-05 (×18): qty 2

## 2011-08-05 MED ORDER — SIMVASTATIN 20 MG PO TABS
20.0000 mg | ORAL_TABLET | Freq: Every day | ORAL | Status: DC
  Administered 2011-08-05 – 2011-08-10 (×6): 20 mg via ORAL
  Filled 2011-08-05 (×8): qty 1

## 2011-08-05 MED ORDER — ZOLPIDEM TARTRATE 5 MG PO TABS
5.0000 mg | ORAL_TABLET | Freq: Every day | ORAL | Status: DC
  Administered 2011-08-05 – 2011-08-10 (×6): 5 mg via ORAL
  Filled 2011-08-05 (×6): qty 1

## 2011-08-05 MED ORDER — ALBUTEROL SULFATE HFA 108 (90 BASE) MCG/ACT IN AERS
2.0000 | INHALATION_SPRAY | Freq: Four times a day (QID) | RESPIRATORY_TRACT | Status: DC | PRN
  Filled 2011-08-05: qty 6.7

## 2011-08-05 MED ORDER — VITAMIN C 500 MG PO TABS
500.0000 mg | ORAL_TABLET | Freq: Every day | ORAL | Status: DC
  Administered 2011-08-06 – 2011-08-11 (×6): 500 mg via ORAL
  Filled 2011-08-05 (×6): qty 1

## 2011-08-05 MED ORDER — TIOTROPIUM BROMIDE MONOHYDRATE 18 MCG IN CAPS
18.0000 ug | ORAL_CAPSULE | Freq: Every day | RESPIRATORY_TRACT | Status: DC
  Administered 2011-08-06 – 2011-08-10 (×5): 18 ug via RESPIRATORY_TRACT
  Filled 2011-08-05 (×2): qty 5

## 2011-08-05 MED ORDER — ADULT MULTIVITAMIN W/MINERALS CH
1.0000 | ORAL_TABLET | Freq: Every day | ORAL | Status: DC
  Administered 2011-08-06 – 2011-08-11 (×6): 1 via ORAL
  Filled 2011-08-05 (×6): qty 1

## 2011-08-05 MED ORDER — ALBUTEROL SULFATE (5 MG/ML) 0.5% IN NEBU
2.5000 mg | INHALATION_SOLUTION | Freq: Four times a day (QID) | RESPIRATORY_TRACT | Status: DC
  Administered 2011-08-05 – 2011-08-08 (×10): 2.5 mg via RESPIRATORY_TRACT
  Filled 2011-08-05 (×10): qty 0.5

## 2011-08-05 MED ORDER — ALIGN 4 MG PO CAPS: 1.0000 | ORAL_CAPSULE | Freq: Every day | ORAL | Status: DC

## 2011-08-05 MED ORDER — METHOCARBAMOL 500 MG PO TABS: 500.0000 mg | ORAL_TABLET | Freq: Four times a day (QID) | ORAL | Status: DC | PRN

## 2011-08-05 MED ORDER — DIPHENHYDRAMINE HCL 50 MG/ML IJ SOLN
25.0000 mg | INTRAMUSCULAR | Status: DC
  Administered 2011-08-06 – 2011-08-10 (×5): 25 mg via INTRAVENOUS
  Filled 2011-08-05: qty 0.5
  Filled 2011-08-05: qty 1
  Filled 2011-08-05 (×4): qty 0.5
  Filled 2011-08-05: qty 1
  Filled 2011-08-05 (×2): qty 0.5

## 2011-08-05 MED ORDER — ROFLUMILAST 500 MCG PO TABS
500.0000 ug | ORAL_TABLET | Freq: Every day | ORAL | Status: DC
  Administered 2011-08-06 – 2011-08-11 (×6): 500 ug via ORAL
  Filled 2011-08-05 (×6): qty 1

## 2011-08-05 MED ORDER — DIPHENHYDRAMINE HCL 50 MG/ML IJ SOLN
25.0000 mg | Freq: Once | INTRAMUSCULAR | Status: AC
  Administered 2011-08-05: 25 mg via INTRAVENOUS
  Filled 2011-08-05: qty 1

## 2011-08-05 MED ORDER — PANTOPRAZOLE SODIUM 40 MG PO TBEC
40.0000 mg | DELAYED_RELEASE_TABLET | Freq: Every day | ORAL | Status: DC
  Administered 2011-08-06 – 2011-08-11 (×6): 40 mg via ORAL
  Filled 2011-08-05 (×5): qty 1

## 2011-08-05 MED ORDER — SUCRALFATE 1 G PO TABS
1.0000 g | ORAL_TABLET | Freq: Two times a day (BID) | ORAL | Status: DC
  Administered 2011-08-06 – 2011-08-11 (×9): 1 g via ORAL
  Filled 2011-08-05 (×15): qty 1

## 2011-08-05 MED ORDER — SODIUM CHLORIDE 0.9 % IV SOLN
INTRAVENOUS | Status: DC
  Administered 2011-08-05: 22:00:00 via INTRAVENOUS

## 2011-08-05 MED ORDER — LATANOPROST 0.005 % OP SOLN
1.0000 [drp] | Freq: Every day | OPHTHALMIC | Status: DC
  Administered 2011-08-05 – 2011-08-10 (×6): 1 [drp] via OPHTHALMIC
  Filled 2011-08-05: qty 2.5

## 2011-08-05 MED ORDER — ACETAMINOPHEN 500 MG PO TABS
500.0000 mg | ORAL_TABLET | Freq: Four times a day (QID) | ORAL | Status: DC | PRN
  Administered 2011-08-08 – 2011-08-10 (×2): 500 mg via ORAL
  Filled 2011-08-05 (×2): qty 1

## 2011-08-05 MED ORDER — VANCOMYCIN HCL IN DEXTROSE 1-5 GM/200ML-% IV SOLN
1000.0000 mg | Freq: Once | INTRAVENOUS | Status: AC
  Administered 2011-08-05: 1000 mg via INTRAVENOUS
  Filled 2011-08-05: qty 200

## 2011-08-05 MED ORDER — BIOTENE DRY MOUTH MT LIQD
15.0000 mL | Freq: Two times a day (BID) | OROMUCOSAL | Status: DC
  Administered 2011-08-05 – 2011-08-11 (×10): 15 mL via OROMUCOSAL

## 2011-08-05 MED ORDER — VANCOMYCIN HCL IN DEXTROSE 1-5 GM/200ML-% IV SOLN
1000.0000 mg | INTRAVENOUS | Status: DC
  Administered 2011-08-06 – 2011-08-08 (×3): 1000 mg via INTRAVENOUS
  Filled 2011-08-05 (×5): qty 200

## 2011-08-05 MED ORDER — FLUTICASONE PROPIONATE 50 MCG/ACT NA SUSP
2.0000 | Freq: Every day | NASAL | Status: DC
  Administered 2011-08-06 – 2011-08-08 (×3): 2 via NASAL
  Administered 2011-08-09: 1 via NASAL
  Administered 2011-08-10 – 2011-08-11 (×2): 2 via NASAL
  Filled 2011-08-05: qty 16

## 2011-08-05 MED ORDER — SODIUM CHLORIDE 0.9 % IJ SOLN
10.0000 mL | INTRAMUSCULAR | Status: DC | PRN
  Administered 2011-08-05 – 2011-08-08 (×2): 10 mL

## 2011-08-05 MED ORDER — DULOXETINE HCL 60 MG PO CPEP
60.0000 mg | ORAL_CAPSULE | Freq: Every day | ORAL | Status: DC
  Administered 2011-08-06: 60 mg via ORAL
  Filled 2011-08-05 (×3): qty 1

## 2011-08-05 MED ORDER — CHOLECALCIFEROL 10 MCG (400 UNIT) PO TABS
400.0000 [IU] | ORAL_TABLET | Freq: Every day | ORAL | Status: DC
  Administered 2011-08-06 – 2011-08-11 (×6): 400 [IU] via ORAL
  Filled 2011-08-05 (×6): qty 1

## 2011-08-05 MED ORDER — CALCIUM CARBONATE-VITAMIN D 500-200 MG-UNIT PO TABS
1.0000 | ORAL_TABLET | Freq: Every day | ORAL | Status: DC
  Administered 2011-08-06 – 2011-08-11 (×6): 1 via ORAL
  Filled 2011-08-05 (×6): qty 1

## 2011-08-05 MED ORDER — FLORA-Q PO CAPS
1.0000 | ORAL_CAPSULE | Freq: Every day | ORAL | Status: DC
  Administered 2011-08-05 – 2011-08-11 (×7): 1 via ORAL
  Filled 2011-08-05 (×7): qty 1

## 2011-08-05 MED ORDER — SULFAMETHOXAZOLE-TMP DS 800-160 MG PO TABS
1.0000 | ORAL_TABLET | Freq: Four times a day (QID) | ORAL | Status: DC
  Administered 2011-08-05 – 2011-08-11 (×22): 1 via ORAL
  Filled 2011-08-05 (×25): qty 1

## 2011-08-05 MED ORDER — HYDROCODONE-ACETAMINOPHEN 5-325 MG PO TABS
1.0000 | ORAL_TABLET | Freq: Four times a day (QID) | ORAL | Status: DC
  Administered 2011-08-05 – 2011-08-11 (×22): 1 via ORAL
  Filled 2011-08-05 (×22): qty 1

## 2011-08-05 NOTE — Progress Notes (Signed)
ANTIBIOTIC CONSULT NOTE - INITIAL  Pharmacy Consult for Vancomycin Indication: right chronic prosthetic   Allergies  Allergen Reactions  . Vancomycin Hives    Patient Measurements: Weight: 141 lb 15.6 oz (64.4 kg)   Vital Signs: Temp: 98.1 F (36.7 C) (02/15 1439) BP: 135/65 mmHg (02/15 1439) Pulse Rate: 80  (02/15 1439) Intake/Output from previous day:   Intake/Output from this shift:    Labs: No results found for this basename: WBC:3,HGB:3,PLT:3,LABCREA:3,CREATININE:3 in the last 72 hours CrCl is unknown because there is no height on file for the current visit.   Microbiology: No results found for this or any previous visit (from the past 720 hour(s)).  Medical History: Past Medical History  Diagnosis Date  . Arthritis   . COPD (chronic obstructive pulmonary disease)   . Osteoporosis   . Hypertension   . Paresthesia   . 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 Shortness of breath     with activity  . Asthma   . GERD (gastroesophageal reflux disease)     Medications:  See med rec  Assessment: 78 yo F admitted with right hip pain and history of chronic prosthetic hip joint infections.  Patient with hx of hives/turning red with vancomycin.  Will premedicate with Benadryl per Dr. Magnus Ivan.  Also on Bactrim DS (has been on for chronic suppression).   In June 2010 was on 1250 mg IV q 24hr, scr ~0.7, similar weight, which yielded a vancomycin trough of 18.3.  Now with scr of 1.06 and crcl ~40 ml/min.  Will slightly decrease dose.  Goal of Therapy:  Vancomycin trough 15-20  Plan:  1.  Vancomycin 1000mg  IV q 24hr 2.  Premedicate with Benadryl 30 min prior to each vancomycin dose 3.  F/U any cultures, pt response  Rolland Porter, Pharm.D., BCPS Clinical Pharmacist Pager: [phone]

## 2011-08-05 NOTE — H&P (Signed)
Bridget Duncan is an 78 y.o. female.   Chief Complaint: Right hip pain with history of chronic prosthetic hip joint infections HPI:   78 yo female with chronically suppressed infection of a right total hip prosthesis.  She has had multiple surgeries over the years for that hip with the last being two years ago.  She has been on daily Bactrim DS for this.  The hope has been always to retain the hardware and suppress the infection due to her poor medical/health status.  She developed worsening right hip pain last week.  I saw her in the office yesterday and drained fluid from her hip.  I am admitting her today to get a PICC line placed, start IV antibiotics, and likely perform a repeat I&D in a few days.  Past Medical History  Diagnosis Date  . Arthritis   . COPD (chronic obstructive pulmonary disease)   . Osteoporosis   . Hypertension   . Paresthesia   . 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 Shortness of breath     with activity  . Asthma   . GERD (gastroesophageal reflux disease)     Past Surgical History  Procedure Date  . Joint replacement     r thr 2006  . Other surgical history     14 surgeries to r hip/femur (post fall with fx and chronic infections)    No family history on file. Social History:  reports that she quit smoking about 10 years ago. She does not have any smokeless tobacco history on file. She reports that she does not drink alcohol or use illicit drugs.  Allergies:  Allergies  Allergen Reactions  . Vancomycin Hives    Medications Prior to Admission  Medication Dose Route Frequency Provider Last Rate Last Dose  . sodium chloride 0.9 % injection 10-40 mL  10-40 mL Intracatheter PRN Kathryne Hitch, MD       Medications Prior to Admission  Medication Sig Dispense Refill  . acetaminophen (TYLENOL) 500 MG tablet Take 500 mg by mouth every 6 (six) hours as needed. For pain      . albuterol (PROVENTIL HFA;VENTOLIN HFA) 108 (90  BASE) MCG/ACT inhaler Inhale 2 puffs into the lungs every 6 (six) hours as needed. For wheezing      . arformoterol (BROVANA) 15 MCG/2ML NEBU Take 15 mcg by nebulization 2 (two) times daily.        . calcium-vitamin D (OSCAL WITH D) 500-200 MG-UNIT per tablet Take 1 tablet by mouth daily.        . diclofenac (FLECTOR) 1.3 % PTCH Place 1 patch onto the skin at bedtime.       . diclofenac sodium (VOLTAREN) 1 % GEL Apply 1 application topically daily.       Marland Kitchen latanoprost (XALATAN) 0.005 % ophthalmic solution 1 drop at bedtime.        . lidocaine (LIDODERM) 5 % Place 1 patch onto the skin daily. Remove & Discard patch within 12 hours or as directed by MD       . methocarbamol (ROBAXIN) 500 MG tablet Take 500 mg by mouth 4 (four) times daily as needed. For spasms      . roflumilast (DALIRESP) 500 MCG TABS tablet Take 500 mcg by mouth daily.          No results found for this or any previous visit (from the past 48 hour(s)). No results found.  Review of Systems  All  other systems reviewed and are negative.    Blood pressure 135/65, pulse 80, temperature 98.1 F (36.7 C), resp. rate 16, SpO2 94.00%. Physical Exam  Constitutional: She is oriented to person, place, and time. Vital signs are normal. She appears cachectic.  HENT:  Head: Normocephalic and atraumatic.  Eyes: Pupils are equal, round, and reactive to light.  Neck: Normal range of motion. Neck supple.  Cardiovascular: Normal rate and regular rhythm.   Respiratory: Effort normal.  GI: Soft. Bowel sounds are normal.  Musculoskeletal:       Right hip: She exhibits tenderness and swelling.  Neurological: She is alert and oriented to person, place, and time.  Skin: Skin is warm and dry.  Psychiatric: She has a normal mood and affect.     Assessment/Plan Admit from IV antibiotics and likely surgical I&D early next week.  Kathryne Hitch 08/05/2011, 6:31 PM

## 2011-08-06 MED ORDER — ONDANSETRON HCL 4 MG/2ML IJ SOLN
4.0000 mg | Freq: Four times a day (QID) | INTRAMUSCULAR | Status: DC | PRN
  Administered 2011-08-06: 4 mg via INTRAVENOUS
  Filled 2011-08-06: qty 2

## 2011-08-06 NOTE — Progress Notes (Signed)
Subjective: No adverse reaction to Vanc. Thus far.  PICC line in place.  Gram stain from my office showed a lot of WBCs, but no organisms.  However, she has been on daily antibiotics for years.   Objective: Vital signs in last 24 hours: Temp:  [97.5 F (36.4 C)-98.5 F (36.9 C)] 97.5 F (36.4 C) (02/16 0539) Pulse Rate:  [75-80] 75  (02/16 0539) Resp:  [16-18] 18  (02/16 0539) BP: (134-142)/(65-76) 142/76 mmHg (02/16 0539) SpO2:  [92 %-97 %] 95 % (02/16 0853) Weight:  [64.4 kg (141 lb 15.6 oz)] 64.4 kg (141 lb 15.6 oz) (02/15 2039)  Intake/Output from previous day: 02/15 0701 - 02/16 0700 In: 452.7 [P.O.:240; I.V.:12.7; IV Piggyback:200] Out: 100 [Urine:100] Intake/Output this shift:     Basename 08/05/11 2109  HGB 9.3*    Basename 08/05/11 2109  WBC 6.2  RBC 3.10*  HCT 28.8*  PLT 302    Basename 08/05/11 2109  NA 132*  K 4.5  CL 97  CO2 30  BUN 18  CREATININE 1.06  GLUCOSE 104*  CALCIUM 9.8   No results found for this basename: LABPT:2,INR:2 in the last 72 hours  Sensation intact distally Intact pulses distally No cellulitis present Pain over right hip to palpation  Assessment/Plan: Will continue IV antibiotics.  Likely surgery next week for repeat I&D. No DVT meds due to bleeding risk (she is fall risk and anemic as well as will be having surgery)   Nayah Lukens Y 08/06/2011, 9:29 AM

## 2011-08-07 NOTE — Progress Notes (Signed)
ANTIBIOTIC CONSULT NOTE - INITIAL  Pharmacy Consult for Vancomycin Indication: right chronic prosthetic   Allergies  Allergen Reactions  . Vancomycin Hives    Patient Measurements: Weight: 141 lb 15.6 oz (64.4 kg)   Vital Signs: Temp: 98.1 F (36.7 C) (02/17 0502) Temp src: Oral (02/17 0502) BP: 147/68 mmHg (02/17 0502) Pulse Rate: 80  (02/17 0502) Intake/Output from previous day: 02/16 0701 - 02/17 0700 In: 1140 [P.O.:720; I.V.:420] Out: -  Intake/Output from this shift:    Labs:  [facility] 08/05/11 2109  WBC 6.2  HGB 9.3*  PLT 302  LABCREA --  CREATININE 1.06   CrCl is unknown because there is no height on file for the current visit.   Microbiology: Recent Results (from the past 720 hour(s))  MRSA PCR SCREENING     Status: Normal   Collection Time   08/05/11  9:00 PM      Component Value Range Status Comment   MRSA by PCR NEGATIVE  NEGATIVE  Final     Medical History: Past Medical History  Diagnosis Date  . Arthritis   . COPD (chronic obstructive pulmonary disease)   . Osteoporosis   . Hypertension   . Paresthesia   . 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 Shortness of breath     with activity  . Asthma   . GERD (gastroesophageal reflux disease)     Medications:  See med rec  Assessment: 78 yo F admitted with right hip pain and history of chronic prosthetic hip joint infections.  Patient with hx of hives/turning red with vancomycin.  Will premedicate with Benadryl per Dr. Magnus Ivan.  Also on Bactrim DS (has been on for chronic suppression).   In June 2010 was on 1250 mg IV q 24hr, scr ~0.7, similar weight, which yielded a vancomycin trough of 18.3.  Now with scr of 1.06 and crcl ~40 ml/min.  Will slightly decrease dose.  Goal of Therapy:  Vancomycin trough 15-20  Plan:  1.  Continue Vancomycin 1000mg  IV q 24hr 2.  Premedicate with Benadryl 30 min prior to each vancomycin dose. 3.  F/U any cultures, pt response 4. Will  plan to check a trough level tomorrow since patient likely to d/c home with PICC on vancomycin.  Reece Leader, Pharm D 08/07/2011 11:15 AM

## 2011-08-07 NOTE — Progress Notes (Signed)
Patient ID: Bridget Duncan, female   DOB: [birth year], 78 y.o.   MRN: [id number] Feeling better overall.  No adverse reactions to Vancomycin.  Less hip pain on exam.  Will continue IV antibiotics and consider a surgical intervention on Tuesday if continued pain.  Will likely D/C to home with PICC line and IV Vanc.

## 2011-08-08 MED ORDER — VANCOMYCIN HCL 1000 MG IV SOLR
750.0000 mg | Freq: Two times a day (BID) | INTRAVENOUS | Status: DC
  Administered 2011-08-09 – 2011-08-11 (×4): 750 mg via INTRAVENOUS
  Filled 2011-08-08 (×7): qty 750

## 2011-08-08 MED ORDER — ALBUTEROL SULFATE (5 MG/ML) 0.5% IN NEBU
2.5000 mg | INHALATION_SOLUTION | Freq: Two times a day (BID) | RESPIRATORY_TRACT | Status: DC
  Administered 2011-08-08 – 2011-08-11 (×6): 2.5 mg via RESPIRATORY_TRACT
  Filled 2011-08-08 (×6): qty 0.5

## 2011-08-08 MED ORDER — DOCUSATE SODIUM 100 MG PO CAPS
100.0000 mg | ORAL_CAPSULE | Freq: Two times a day (BID) | ORAL | Status: DC | PRN
  Administered 2011-08-10: 100 mg via ORAL
  Filled 2011-08-08: qty 1

## 2011-08-08 NOTE — Progress Notes (Signed)
Patient ID: Bridget Duncan, female   DOB: 28-Sep-1925, 78 y.o.   MRN: [id number] Still with less pain.  Will decide on surgery for tomorrow by late today.

## 2011-08-08 NOTE — Progress Notes (Signed)
ANTIBIOTIC CONSULT NOTE - INITIAL  Pharmacy Consult for Vancomycin Indication: right chronic prosthetic hip joint infections  Allergies  Allergen Reactions  . Vancomycin Hives    Patient Measurements: Weight: 141 lb 15.6 oz (64.4 kg)   Vital Signs: Temp: 98 F (36.7 C) (02/18 2109) BP: 144/73 mmHg (02/18 2109) Pulse Rate: 90  (02/18 2109) Intake/Output from previous day: 02/17 0701 - 02/18 0700 In: 2280 [P.O.:1320; I.V.:560; IV Piggyback:400] Out: -  Intake/Output from this shift:    Labs: No results found for this basename: WBC:3,HGB:3,PLT:3,LABCREA:3,CREATININE:3 in the last 72 hours CrCl is unknown because there is no height on file for the current visit.   Microbiology: Recent Results (from the past 720 hour(s))  MRSA PCR SCREENING     Status: Normal   Collection Time   08/05/11  9:00 PM      Component Value Range Status Comment   MRSA by PCR NEGATIVE  NEGATIVE  Final     Medical History: Past Medical History  Diagnosis Date  . Arthritis   . COPD (chronic obstructive pulmonary disease)   . Osteoporosis   . Hypertension   . Paresthesia   . 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 Shortness of breath     with activity  . Asthma   . GERD (gastroesophageal reflux disease)      Assessment: 78 yo F admitted with right hip pain and history of chronic prosthetic hip joint infections.  Patient with hx of hives/turning red with vancomycin.  Will premedicate with Benadryl per Dr. Magnus Ivan.  Also on Bactrim DS (has been on for chronic suppression).   Trough 10.3 less than desired goal.  Goal of Therapy:  Vancomycin trough 15-20  Plan:  1.  Increase Vancomycin to 750mg  IV q12h. 2.  Premedicate with Benadryl 30 min prior to each vancomycin dose.  Misty Stanley, PharmD, BCPS 08/08/11 2300

## 2011-08-09 ENCOUNTER — Encounter (HOSPITAL_COMMUNITY)
Admission: AD | Disposition: A | Discharge status: Home / Self Care | Payer: Self-pay | Source: Ambulatory Visit | Attending: Orthopaedic Surgery

## 2011-08-09 ENCOUNTER — Encounter (HOSPITAL_COMMUNITY): Payer: Self-pay | Admitting: Anesthesiology

## 2011-08-09 ENCOUNTER — Other Ambulatory Visit: Payer: Self-pay

## 2011-08-09 ENCOUNTER — Inpatient Hospital Stay (HOSPITAL_COMMUNITY): Payer: Medicare Other

## 2011-08-09 ENCOUNTER — Inpatient Hospital Stay (HOSPITAL_COMMUNITY): Payer: Medicare Other | Admitting: Anesthesiology

## 2011-08-09 HISTORY — PX: INCISION AND DRAINAGE HIP: SHX1801

## 2011-08-09 SURGERY — IRRIGATION AND DEBRIDEMENT HIP
Anesthesia: General | Site: Hip | Laterality: Right | Wound class: Dirty or Infected

## 2011-08-09 MED ORDER — PROPOFOL 10 MG/ML IV EMUL
INTRAVENOUS | Status: DC | PRN
  Administered 2011-08-09: 100 mg via INTRAVENOUS

## 2011-08-09 MED ORDER — FENTANYL CITRATE 0.05 MG/ML IJ SOLN
INTRAMUSCULAR | Status: DC | PRN
  Administered 2011-08-09: 25 ug via INTRAVENOUS
  Administered 2011-08-09 (×2): 50 ug via INTRAVENOUS

## 2011-08-09 MED ORDER — ONDANSETRON HCL 4 MG/2ML IJ SOLN
INTRAMUSCULAR | Status: DC | PRN
  Administered 2011-08-09: 4 mg via INTRAVENOUS

## 2011-08-09 MED ORDER — ONDANSETRON HCL 4 MG/2ML IJ SOLN: 4.0000 mg | Freq: Once | INTRAMUSCULAR | Status: AC | PRN

## 2011-08-09 MED ORDER — LACTATED RINGERS IV SOLN
INTRAVENOUS | Status: DC
  Administered 2011-08-09: 15:00:00 via INTRAVENOUS

## 2011-08-09 MED ORDER — GLYCOPYRROLATE 0.2 MG/ML IJ SOLN
INTRAMUSCULAR | Status: DC | PRN
  Administered 2011-08-09: .4 mg via INTRAVENOUS

## 2011-08-09 MED ORDER — MEPERIDINE HCL 25 MG/ML IJ SOLN: 6.2500 mg | INTRAMUSCULAR | Status: DC | PRN

## 2011-08-09 MED ORDER — VANCOMYCIN HCL 1000 MG IV SOLR
1000.0000 mg | INTRAVENOUS | Status: AC
  Filled 2011-08-09: qty 1000

## 2011-08-09 MED ORDER — HYDROMORPHONE HCL PF 1 MG/ML IJ SOLN
0.2500 mg | INTRAMUSCULAR | Status: DC | PRN
  Administered 2011-08-09 (×4): 0.5 mg via INTRAVENOUS

## 2011-08-09 MED ORDER — ROCURONIUM BROMIDE 100 MG/10ML IV SOLN
INTRAVENOUS | Status: DC | PRN
  Administered 2011-08-09: 35 mg via INTRAVENOUS

## 2011-08-09 MED ORDER — MORPHINE SULFATE 2 MG/ML IJ SOLN: 0.0500 mg/kg | INTRAMUSCULAR | Status: DC | PRN

## 2011-08-09 MED ORDER — VANCOMYCIN HCL 1000 MG IV SOLR
INTRAVENOUS | Status: DC | PRN
  Administered 2011-08-09: 1000 mg

## 2011-08-09 MED ORDER — VANCOMYCIN HCL 1000 MG IV SOLR
1000.0000 mg | INTRAVENOUS | Status: DC
Start: 2011-08-09 — End: 2011-08-09
  Filled 2011-08-09: qty 1000

## 2011-08-09 MED ORDER — NEOSTIGMINE METHYLSULFATE 1 MG/ML IJ SOLN
INTRAMUSCULAR | Status: DC | PRN
  Administered 2011-08-09: 3 mg via INTRAVENOUS

## 2011-08-09 MED ORDER — SODIUM CHLORIDE 0.9 % IR SOLN
Status: DC | PRN
  Administered 2011-08-09: 17:00:00

## 2011-08-09 MED ORDER — LACTATED RINGERS IV SOLN
INTRAVENOUS | Status: DC | PRN
  Administered 2011-08-09: 16:00:00 via INTRAVENOUS

## 2011-08-09 MED ORDER — HYDROMORPHONE HCL PF 1 MG/ML IJ SOLN
INTRAMUSCULAR | Status: AC
  Filled 2011-08-09: qty 1

## 2011-08-09 SURGICAL SUPPLY — 43 items
BAG DECANTER FOR FLEXI CONT (MISCELLANEOUS) IMPLANT
CLOTH BEACON ORANGE TIMEOUT ST (SAFETY) ×2 IMPLANT
COVER SURGICAL LIGHT HANDLE (MISCELLANEOUS) ×2 IMPLANT
DRAPE ORTHO SPLIT 77X108 STRL (DRAPES) ×2
DRAPE SURG ORHT 6 SPLT 77X108 (DRAPES) ×2 IMPLANT
DRAPE U-SHAPE 47X51 STRL (DRAPES) ×2 IMPLANT
DRSG ADAPTIC 3X8 NADH LF (GAUZE/BANDAGES/DRESSINGS) ×2 IMPLANT
DRSG MEPILEX BORDER 4X8 (GAUZE/BANDAGES/DRESSINGS) ×2 IMPLANT
DRSG PAD ABDOMINAL 8X10 ST (GAUZE/BANDAGES/DRESSINGS) IMPLANT
DURAPREP 26ML APPLICATOR (WOUND CARE) ×2 IMPLANT
ELECT CAUTERY BLADE 6.4 (BLADE) IMPLANT
ELECT REM PT RETURN 9FT ADLT (ELECTROSURGICAL) ×2
ELECTRODE REM PT RTRN 9FT ADLT (ELECTROSURGICAL) ×1 IMPLANT
GAUZE XEROFORM 1X8 LF (GAUZE/BANDAGES/DRESSINGS) ×2 IMPLANT
GLOVE BIOGEL M 8.0 STRL (GLOVE) ×2 IMPLANT
GLOVE BIOGEL PI IND STRL 8 (GLOVE) ×1 IMPLANT
GLOVE BIOGEL PI INDICATOR 8 (GLOVE) ×1
GOWN PREVENTION PLUS LG XLONG (DISPOSABLE) IMPLANT
GOWN PREVENTION PLUS XLARGE (GOWN DISPOSABLE) ×4 IMPLANT
GOWN STRL NON-REIN LRG LVL3 (GOWN DISPOSABLE) IMPLANT
HANDPIECE INTERPULSE COAX TIP (DISPOSABLE)
KIT BASIN OR (CUSTOM PROCEDURE TRAY) ×2 IMPLANT
KIT ROOM TURNOVER OR (KITS) ×2 IMPLANT
KIT STIMULAN RAPID CURE  10CC (Orthopedic Implant) ×1 IMPLANT
KIT STIMULAN RAPID CURE 10CC (Orthopedic Implant) ×1 IMPLANT
MANIFOLD NEPTUNE II (INSTRUMENTS) ×2 IMPLANT
NS IRRIG 1000ML POUR BTL (IV SOLUTION) ×2 IMPLANT
PACK TOTAL JOINT (CUSTOM PROCEDURE TRAY) ×2 IMPLANT
PAD ARMBOARD 7.5X6 YLW CONV (MISCELLANEOUS) ×4 IMPLANT
SET HNDPC FAN SPRY TIP SCT (DISPOSABLE) IMPLANT
SPONGE GAUZE 4X4 12PLY (GAUZE/BANDAGES/DRESSINGS) ×2 IMPLANT
SPONGE LAP 18X18 X RAY DECT (DISPOSABLE) ×2 IMPLANT
STAPLER VISISTAT 35W (STAPLE) IMPLANT
SUT VIC AB 0 CT1 27 (SUTURE) ×2
SUT VIC AB 0 CT1 27XBRD ANBCTR (SUTURE) ×2 IMPLANT
SUT VIC AB 1 CTB1 27 (SUTURE) ×2 IMPLANT
SUT VIC AB 2-0 CT1 27 (SUTURE) ×2
SUT VIC AB 2-0 CT1 TAPERPNT 27 (SUTURE) ×2 IMPLANT
TOWEL OR 17X24 6PK STRL BLUE (TOWEL DISPOSABLE) ×2 IMPLANT
TOWEL OR 17X26 10 PK STRL BLUE (TOWEL DISPOSABLE) ×2 IMPLANT
TUBE ANAEROBIC SPECIMEN COL (MISCELLANEOUS) IMPLANT
UNDERPAD 30X30 INCONTINENT (UNDERPADS AND DIAPERS) IMPLANT
WATER STERILE IRR 1000ML POUR (IV SOLUTION) IMPLANT

## 2011-08-09 NOTE — Anesthesia Preprocedure Evaluation (Addendum)
Anesthesia Evaluation  Patient identified by MRN, date of birth, ID band Patient awake    Reviewed: Allergy & Precautions, H&P , NPO status , Patient's Chart, lab work & pertinent test results  Airway Mallampati: I TM Distance: >3 FB Neck ROM: Full    Dental No notable dental hx. (+) Teeth Intact, Dental Advisory Given and Edentulous Upper   Pulmonary  clear to auscultation        Cardiovascular Normal    Neuro/Psych    GI/Hepatic   Endo/Other  Diabetes mellitus-, Type 2, Oral Hypoglycemic Agents  Renal/GU      Musculoskeletal   Abdominal   Peds  Hematology   Anesthesia Other Findings   Reproductive/Obstetrics                           Anesthesia Physical Anesthesia Plan  ASA: III  Anesthesia Plan: General   Post-op Pain Management:    Induction: Intravenous  Airway Management Planned: Oral ETT  Additional Equipment:   Intra-op Plan:   Post-operative Plan: Extubation in OR  Informed Consent: I have reviewed the patients History and Physical, chart, labs and discussed the procedure including the risks, benefits and alternatives for the proposed anesthesia with the patient or authorized representative who has indicated his/her understanding and acceptance.   Dental advisory given  Plan Discussed with: Anesthesiologist, Surgeon and CRNA  Anesthesia Plan Comments:         Anesthesia Quick Evaluation

## 2011-08-09 NOTE — Brief Op Note (Signed)
08/05/2011 - 08/09/2011  4:57 PM  PATIENT:  Bridget Duncan  78 y.o. female  PRE-OPERATIVE DIAGNOSIS:  chronic right hip infection  POST-OPERATIVE DIAGNOSIS:  chronic right hip infection  PROCEDURE:  Procedure(s) (LRB): IRRIGATION AND DEBRIDEMENT HIP (Right)  SURGEON:  Surgeon(s) and Role:    * Kathryne Hitch, MD - Primary  PHYSICIAN ASSISTANT:   ASSISTANTS: none   ANESTHESIA:   general  EBL:  Total I/O In: 600 [I.V.:600] Out: 50 [Blood:50]  BLOOD ADMINISTERED:none  DRAINS: none   LOCAL MEDICATIONS USED:  NONE  SPECIMEN:  No Specimen  DISPOSITION OF SPECIMEN:  N/A  COUNTS:  YES  TOURNIQUET:  * No tourniquets in log *  DICTATION: .Other Dictation: Dictation Number [id number]  PLAN OF CARE: Admit to inpatient   PATIENT DISPOSITION:  PACU - hemodynamically stable.   Delay start of Pharmacological VTE agent (>24hrs) due to surgical blood loss or risk of bleeding: yes

## 2011-08-09 NOTE — H&P (Signed)
  See H&P and each daily note in EPIC.  To the OR today for an I&D of a chronically infected right total hip.

## 2011-08-09 NOTE — Progress Notes (Signed)
Patient ID: Bridget Duncan, female   DOB: [birth year], 78 y.o.   MRN: [id number] No acute changes overnight.  Still enough right hip pain at her incision to warrant an I&D of her right hip tonight.  She understands the risks and benefits.

## 2011-08-09 NOTE — Progress Notes (Signed)
Utilization review completed. Shalisha Clausing, RN, BSN. 08/09/11 

## 2011-08-09 NOTE — Anesthesia Postprocedure Evaluation (Signed)
  Anesthesia Post-op Note  Patient: Chelle Cayton Crow  Procedure(s) Performed: Procedure(s) (LRB): IRRIGATION AND DEBRIDEMENT HIP (Right)  Patient Location: PACU  Anesthesia Type: General  Level of Consciousness: awake, alert  and oriented  Airway and Oxygen Therapy: Patient Spontanous Breathing and Patient connected to nasal cannula oxygen  Post-op Pain: mild  Post-op Assessment: Post-op Vital signs reviewed, Patient's Cardiovascular Status Stable, Respiratory Function Stable, Patent Airway, No signs of Nausea or vomiting and Pain level controlled  Post-op Vital Signs: Reviewed and stable  Complications: No apparent anesthesia complications

## 2011-08-09 NOTE — Transfer of Care (Signed)
Immediate Anesthesia Transfer of Care Note  Patient: Bridget Duncan  Procedure(s) Performed: Procedure(s) (LRB): IRRIGATION AND DEBRIDEMENT HIP (Right)  Patient Location: PACU  Anesthesia Type: General  Level of Consciousness: awake, alert , oriented and patient cooperative  Airway & Oxygen Therapy: Patient Spontanous Breathing and Patient connected to nasal cannula oxygen  Post-op Assessment: Report given to PACU RN, Post -op Vital signs reviewed and stable and Patient moving all extremities X 4  Post vital signs: Reviewed and stable  Complications: No apparent anesthesia complications

## 2011-08-09 NOTE — Preoperative (Signed)
Beta Blockers   Reason not to administer Beta Blockers:Not Applicable 

## 2011-08-10 LAB — BASIC METABOLIC PANEL
BUN: 11 mg/dL (ref 6–23)
Chloride: 97 mEq/L (ref 96–112)
Glucose, Bld: 81 mg/dL (ref 70–99)
Potassium: 4.2 mEq/L (ref 3.5–5.1)

## 2011-08-10 NOTE — Op Note (Signed)
NAMEMarland Kitchen  Bridget Duncan, Bridget Duncan NO.:  [id number]  MEDICAL RECORD NO.:  [id number]  LOCATION:  5008                         FACILITY:  [facility]  PHYSICIAN:  Vanita Panda. Magnus Ivan, M.D.DATE OF BIRTH:  March 31, 1926  DATE OF PROCEDURE:  08/09/2011 DATE OF DISCHARGE:                              OPERATIVE REPORT   PREOPERATIVE DIAGNOSIS:  Chronic right hip infection.  POSTOPERATIVE DIAGNOSIS:  Chronic right hip infection.  PROCEDURE:  Irrigation and debridement of right hip joint.  SURGEON:  Vanita Panda. Magnus Ivan, MD  ANESTHESIA:  General.  BLOOD LOSS:  Less than 50 mL.  COMPLICATIONS:  None.  INDICATIONS:  Bridget Duncan is an 78 year old patient well known to me. She has had a chronic right hip infection for many years and is on suppressive antibiotics includes Bactrim double-strength.  She has had over 10 surgeries to this hip with most of these being in [place], [place].  She eventually moved to be with her family in the [place] area.  She then came to see me and has had multiple irrigation and debridement over the years and she will go for months and years without needing surgery.  The goal has always been to keep the hardware given the fact their family does not wish that would be removed and she does need it because of it was removed and easily stay out permanently.  She developed pain in her hip last week, it has been 2 years since we have irrigated the hip.  In the office, I withdrew dark fluid from the hip that was negative for organisms; however, she has been on antibiotics. She had a high white blood cell count.  I then admitted her for IV antibiotics and with continued hip pain.  We decided to take her to the operating room to irrigate the hip.  Risks and benefits of this were explained to her and well understood, and she does wish to proceed with surgery.  PROCEDURE DESCRIPTION:  After informed consent was obtained, appropriate right hip was  marked.  She was brought to the operating room and placed supine on the operating table.  General anesthesia was then obtained. She was then turned into a lateral position with the right operative hip up.  There was padding under her knees and legs as well.  We draped out the hip and then prepped the hip with DuraPrep and draped it out as well.  A time-out was called and she was identified as the correct patient and correct right hip.  I then made a small incision directly over the center of the previous incision and dissected down to the hip joint.  There was a large fluid collection under pressure and we suctioned out completely.  I then removed necrotic debris around this and used 3 liters of pulsatile lavage solution to lavage out the joint. I then packed the joint with a stimulant mixed with vancomycin and gentamicin.  I then closed the deep tissue with #1 Vicryl followed by 0 Vicryl, 2-0 Vicryl, and interrupted 2-0 nylon on the skin.  Well-padded sterile dressing was applied.  She was awakened, extubated, and taken to the recovery room in stable condition.  All final counts were  correct. There were no complications noted.     Vanita Panda. Magnus Ivan, M.D.     CYB/MEDQ  D:  08/09/2011  T:  08/10/2011  Job:  [id number]

## 2011-08-10 NOTE — Progress Notes (Signed)
Patient ID: Bridget Duncan, female   DOB: 03-31-1926, 78 y.o.   MRN: [id number] Surgery went well late yesterday. I&D performed on right hip.  Will continue IV abx today and likely d/c to home tomorrow or Friday, but not on IV abx.

## 2011-08-11 NOTE — Discharge Summary (Signed)
Patient ID: Bridget Duncan MRN: [id number] DOB/AGE: July 21, 1925 78 y.o.  Admit date: 08/05/2011 Discharge date: 08/11/2011  Admission Diagnoses:  Principal Problem:  *Infected prosthesis of right hip   Discharge Diagnoses:  Same  Past Medical History  Diagnosis Date  . Arthritis   . COPD (chronic obstructive pulmonary disease)   . Osteoporosis   . Hypertension   . Paresthesia   . 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 Shortness of breath     with activity  . Asthma   . GERD (gastroesophageal reflux disease)     Surgeries: Procedure(s): IRRIGATION AND DEBRIDEMENT HIP on 08/05/2011 - 08/09/2011   Consultants:    Discharged Condition: Improved  Hospital Course: Bridget Duncan is an 78 y.o. female who was admitted 08/05/2011 for operative treatment ofInfected prosthesis of right hip. Patient has severe unremitting pain that affects sleep, daily activities, and work/hobbies. After pre-op clearance the patient was taken to the operating room on 08/05/2011 - 08/09/2011 and underwent  Procedure(s): IRRIGATION AND DEBRIDEMENT HIP.    Patient was given perioperative antibiotics: Anti-infectives     Start     Dose/Rate Route Frequency Ordered Stop   08/09/11 1637   gentamycin 80 mg in 0.9% normal saline 250 mL irrigation  Status:  Discontinued          As needed 08/09/11 1637 08/09/11 1651   08/09/11 1636   vancomycin (VANCOCIN) powder  Status:  Discontinued          As needed 08/09/11 1636 08/09/11 1651   08/09/11 1600   vancomycin (VANCOCIN) powder 1,000 mg        1,000 mg Other To Surgery 08/09/11 1555 08/10/11 1600   08/09/11 1315   vancomycin (VANCOCIN) powder 1,000 mg  Status:  Discontinued        1,000 mg Other To Surgery 08/09/11 1313 08/09/11 1828   08/09/11 1000   vancomycin (VANCOCIN) 750 mg in sodium chloride 0.9 % 150 mL IVPB        750 mg 150 mL/hr over 60 Minutes Intravenous Every 12 hours 08/08/11 2300     08/06/11 2200   vancomycin  (VANCOCIN) IVPB 1000 mg/200 mL premix  Status:  Discontinued        1,000 mg 100 mL/hr over 120 Minutes Intravenous Every 24 hours 08/05/11 2230 08/08/11 2300   08/05/11 2200  sulfamethoxazole-trimethoprim (BACTRIM DS) 800-160 MG per tablet 1 tablet       1 tablet Oral 4 times daily 08/05/11 1850     08/05/11 2200   vancomycin (VANCOCIN) IVPB 1000 mg/200 mL premix        1,000 mg 100 mL/hr over 120 Minutes Intravenous  Once 08/05/11 2104 08/06/11 0039           Patient was given sequential compression devices, early ambulation, and chemoprophylaxis to prevent DVT.  Patient benefited maximally from hospital stay and there were no complications.    Recent vital signs: Patient Vitals for the past 24 hrs:  BP Temp Pulse Resp SpO2  08/11/11 0531 128/72 mmHg 98 F (36.7 C) 75  18  94 %  08/10/11 2040 - - 98  20  93 %  08/10/11 2035 176/71 mmHg 98.7 F (37.1 C) 120  18  92 %  08/10/11 1400 112/59 mmHg 99 F (37.2 C) 85  20  95 %  08/10/11 0942 - - - - 95 %     Recent laboratory studies:  [facility] 08/10/11 0525  WBC --  HGB --  HCT --  PLT --  NA 133*  K 4.2  CL 97  CO2 31  BUN 11  CREATININE 0.80  GLUCOSE 81  INR --  CALCIUM 10.3     Discharge Medications:   Medication List  As of 08/11/2011  6:04 AM   TAKE these medications         acetaminophen 500 MG tablet   Commonly known as: TYLENOL   Take 500 mg by mouth every 6 (six) hours as needed. For pain      albuterol 108 (90 BASE) MCG/ACT inhaler   Commonly known as: PROVENTIL HFA;VENTOLIN HFA   Inhale 2 puffs into the lungs every 6 (six) hours as needed. For wheezing      albuterol (2.5 MG/3ML) 0.083% nebulizer solution   Commonly known as: PROVENTIL   Take 2.5 mg by nebulization every 6 (six) hours as needed. For wheezing      ALIGN 4 MG Caps   Take 1 capsule by mouth daily.      arformoterol 15 MCG/2ML Nebu   Commonly known as: BROVANA   Take 15 mcg by nebulization 2 (two) times daily.       atorvastatin 10 MG tablet   Commonly known as: LIPITOR   Take 5 mg by mouth 3 (three) times a week. On Monday, Wednesday, and Friday.      calcitonin (salmon) 200 UNIT/ACT nasal spray   Commonly known as: MIACALCIN/FORTICAL   Place 1 spray into the nose daily.      calcium-vitamin D 500-200 MG-UNIT per tablet   Commonly known as: OSCAL WITH D   Take 1 tablet by mouth daily.      cholecalciferol 400 UNITS Tabs   Commonly known as: VITAMIN D   Take 400 Units by mouth daily.      Cranberry Extract 250 MG Tabs   Take 1 tablet by mouth daily as needed. For bladder      diclofenac 1.3 % Ptch   Commonly known as: FLECTOR   Place 1 patch onto the skin at bedtime.      diclofenac sodium 1 % Gel   Commonly known as: VOLTAREN   Apply 1 application topically daily.      DULoxetine 60 MG capsule   Commonly known as: CYMBALTA   Take 60 mg by mouth daily.      fish oil-omega-3 fatty acids 1000 MG capsule   Take 1 g by mouth daily.      fluticasone 50 MCG/ACT nasal spray   Commonly known as: FLONASE   Place 2 sprays into the nose daily.      HYDROcodone-acetaminophen 5-325 MG per tablet   Commonly known as: NORCO   Take 1 tablet by mouth 4 (four) times daily. For pain      latanoprost 0.005 % ophthalmic solution   Commonly known as: XALATAN   1 drop at bedtime.      lidocaine 5 %   Commonly known as: LIDODERM   Place 1 patch onto the skin daily. Remove & Discard patch within 12 hours or as directed by MD      meloxicam 7.5 MG tablet   Commonly known as: MOBIC   Take 7.5 mg by mouth 2 (two) times daily.      mesalamine 400 MG EC tablet   Commonly known as: ASACOL   Take 800 mg by mouth 2 (two) times daily.      methocarbamol 500 MG tablet  Commonly known as: ROBAXIN   Take 500 mg by mouth 4 (four) times daily as needed. For spasms      mulitivitamin with minerals Tabs   Take 1 tablet by mouth daily.      pantoprazole 40 MG tablet   Commonly known as: PROTONIX   Take  40 mg by mouth daily.      roflumilast 500 MCG Tabs tablet   Commonly known as: DALIRESP   Take 500 mcg by mouth daily.      sucralfate 1 G tablet   Commonly known as: CARAFATE   Take 1 g by mouth 2 (two) times daily.      sulfamethoxazole-trimethoprim 800-160 MG per tablet   Commonly known as: BACTRIM DS   Take 1 tablet by mouth 4 (four) times daily. For hip infection      tiotropium 18 MCG inhalation capsule   Commonly known as: SPIRIVA   Place 18 mcg into inhaler and inhale daily.      vitamin C 500 MG tablet   Commonly known as: ASCORBIC ACID   Take 500 mg by mouth daily.      zolpidem 5 MG tablet   Commonly known as: AMBIEN   Take 5 mg by mouth at bedtime.            Diagnostic Studies: Dg Chest 1 View  08/09/2011  *RADIOLOGY REPORT*  Clinical Data: Preop right leg infection  CHEST - 1 VIEW  Comparison: 01/21/2010  Findings: Chronic interstitial markings. No pleural effusion or pneumothorax.  Cardiomediastinal silhouette is within normal limits.  The right subclavian PICC with its tip at the cavoatrial junction.  IMPRESSION: No evidence of acute cardiopulmonary disease.  Original Report Authenticated By: Charline Bills, M.D.    Disposition: 01-Home or Self Care  Discharge Orders    Future Orders Please Complete By Expires   Nursing communication      Scheduling Instructions:   Please have her PICC line removed today prior to discharge.  Also, place new dry dressing on right hip incision prior to discharge to home today 2/21.         SignedKathryne Hitch 08/11/2011, 6:04 AM

## 2011-08-11 NOTE — Discharge Instructions (Signed)
You may resume all your home medications that you were on prior to admission. You can start getting your right hip incision wet starting Monday 2/25. Call [facility] [phone]) for a follow-up appointment in 2 weeks. Place a dry dressing on your right hip incision daily which can even be big band-aids. Increase your activities as comfort allows.

## 2011-08-11 NOTE — Progress Notes (Signed)
Vancomycin was ordered for pt this am, but pt plans to D/C home without PICC line. Called Dr. Magnus Ivan to clarify if he wanted Vanc discontinued like was stated in his D/C summary. He verbalizes to discontinue vancomycin, pt is on oral abx. Tammy Sours

## 2011-08-14 ENCOUNTER — Encounter (HOSPITAL_COMMUNITY): Payer: Self-pay | Admitting: Orthopaedic Surgery

## 2011-09-12 ENCOUNTER — Encounter (HOSPITAL_COMMUNITY): Payer: Self-pay

## 2011-09-12 ENCOUNTER — Other Ambulatory Visit (HOSPITAL_COMMUNITY): Payer: Self-pay | Admitting: Orthopaedic Surgery

## 2011-09-13 ENCOUNTER — Encounter (HOSPITAL_COMMUNITY): Payer: Medicare Other

## 2011-09-13 ENCOUNTER — Encounter (HOSPITAL_COMMUNITY)
Admission: RE | Disposition: A | Discharge status: Home / Self Care | Payer: Self-pay | Source: Ambulatory Visit | Attending: Orthopaedic Surgery

## 2011-09-13 ENCOUNTER — Encounter (HOSPITAL_COMMUNITY): Payer: Self-pay | Admitting: Anesthesiology

## 2011-09-13 ENCOUNTER — Ambulatory Visit (HOSPITAL_COMMUNITY)
Admission: RE | Admit: 2011-09-13 | Discharge: 2011-09-14 | Disposition: A | Discharge status: Home / Self Care | Payer: Medicare Other | Source: Ambulatory Visit | Attending: Orthopaedic Surgery | Admitting: Orthopaedic Surgery

## 2011-09-13 ENCOUNTER — Ambulatory Visit (HOSPITAL_COMMUNITY): Payer: Medicare Other | Admitting: Anesthesiology

## 2011-09-13 DIAGNOSIS — I129 Hypertensive chronic kidney disease with stage 1 through stage 4 chronic kidney disease, or unspecified chronic kidney disease: Secondary | ICD-10-CM | POA: Insufficient documentation

## 2011-09-13 DIAGNOSIS — T8451XA Infection and inflammatory reaction due to internal right hip prosthesis, initial encounter: Secondary | ICD-10-CM

## 2011-09-13 DIAGNOSIS — Z96649 Presence of unspecified artificial hip joint: Secondary | ICD-10-CM | POA: Insufficient documentation

## 2011-09-13 DIAGNOSIS — M810 Age-related osteoporosis without current pathological fracture: Secondary | ICD-10-CM | POA: Insufficient documentation

## 2011-09-13 DIAGNOSIS — N182 Chronic kidney disease, stage 2 (mild): Secondary | ICD-10-CM | POA: Insufficient documentation

## 2011-09-13 DIAGNOSIS — K219 Gastro-esophageal reflux disease without esophagitis: Secondary | ICD-10-CM | POA: Insufficient documentation

## 2011-09-13 DIAGNOSIS — J4489 Other specified chronic obstructive pulmonary disease: Secondary | ICD-10-CM | POA: Insufficient documentation

## 2011-09-13 DIAGNOSIS — T8450XA Infection and inflammatory reaction due to unspecified internal joint prosthesis, initial encounter: Secondary | ICD-10-CM | POA: Insufficient documentation

## 2011-09-13 DIAGNOSIS — R209 Unspecified disturbances of skin sensation: Secondary | ICD-10-CM | POA: Insufficient documentation

## 2011-09-13 DIAGNOSIS — J449 Chronic obstructive pulmonary disease, unspecified: Secondary | ICD-10-CM | POA: Insufficient documentation

## 2011-09-13 DIAGNOSIS — Y831 Surgical operation with implant of artificial internal device as the cause of abnormal reaction of the patient, or of later complication, without mention of misadventure at the time of the procedure: Secondary | ICD-10-CM | POA: Insufficient documentation

## 2011-09-13 DIAGNOSIS — J438 Other emphysema: Secondary | ICD-10-CM | POA: Insufficient documentation

## 2011-09-13 HISTORY — PX: INCISION AND DRAINAGE HIP: SHX1801

## 2011-09-13 LAB — BASIC METABOLIC PANEL
BUN: 19 mg/dL (ref 6–23)
Creatinine, Ser: 0.95 mg/dL (ref 0.50–1.10)
GFR calc Af Amer: 62 mL/min — ABNORMAL LOW (ref >90)
GFR calc non Af Amer: 53 mL/min — ABNORMAL LOW (ref >90)
Potassium: 4.5 mEq/L (ref 3.5–5.1)

## 2011-09-13 LAB — CBC
Hemoglobin: 10.8 g/dL — ABNORMAL LOW (ref 12.0–15.0)
MCHC: 32 g/dL (ref 30.0–36.0)
RDW: 15.4 % (ref 11.5–15.5)

## 2011-09-13 SURGERY — IRRIGATION AND DEBRIDEMENT HIP
Anesthesia: General | Site: Hip | Laterality: Right | Wound class: Dirty or Infected

## 2011-09-13 MED ORDER — DULOXETINE HCL 60 MG PO CPEP
60.0000 mg | ORAL_CAPSULE | Freq: Every day | ORAL | Status: DC
  Administered 2011-09-14: 60 mg via ORAL
  Filled 2011-09-13: qty 1

## 2011-09-13 MED ORDER — FENTANYL CITRATE 0.05 MG/ML IJ SOLN
INTRAMUSCULAR | Status: DC | PRN
  Administered 2011-09-13 (×3): 50 ug via INTRAVENOUS

## 2011-09-13 MED ORDER — ONDANSETRON HCL 4 MG PO TABS: 4.0000 mg | ORAL_TABLET | Freq: Four times a day (QID) | ORAL | Status: DC | PRN

## 2011-09-13 MED ORDER — CALCIUM CARBONATE-VITAMIN D 500-200 MG-UNIT PO TABS
1.0000 | ORAL_TABLET | Freq: Every day | ORAL | Status: DC
  Administered 2011-09-13 – 2011-09-14 (×2): 1 via ORAL
  Filled 2011-09-13 (×2): qty 1

## 2011-09-13 MED ORDER — MORPHINE SULFATE 2 MG/ML IJ SOLN: 1.0000 mg | INTRAMUSCULAR | Status: DC | PRN

## 2011-09-13 MED ORDER — ALBUTEROL SULFATE (5 MG/ML) 0.5% IN NEBU
2.5000 mg | INHALATION_SOLUTION | Freq: Four times a day (QID) | RESPIRATORY_TRACT | Status: DC
  Administered 2011-09-13 – 2011-09-14 (×3): 2.5 mg via RESPIRATORY_TRACT
  Filled 2011-09-13 (×3): qty 0.5

## 2011-09-13 MED ORDER — LACTATED RINGERS IV SOLN
INTRAVENOUS | Status: DC
  Administered 2011-09-13: 16:00:00 via INTRAVENOUS

## 2011-09-13 MED ORDER — MELOXICAM 7.5 MG PO TABS
7.5000 mg | ORAL_TABLET | Freq: Two times a day (BID) | ORAL | Status: DC
  Administered 2011-09-13 – 2011-09-14 (×2): 7.5 mg via ORAL
  Filled 2011-09-13 (×3): qty 1

## 2011-09-13 MED ORDER — SODIUM CHLORIDE 0.9 % IR SOLN
Status: DC | PRN
  Administered 2011-09-13: 1000 mL

## 2011-09-13 MED ORDER — HYDROMORPHONE HCL PF 1 MG/ML IJ SOLN
0.2500 mg | INTRAMUSCULAR | Status: DC | PRN
  Administered 2011-09-13 (×3): 0.25 mg via INTRAVENOUS

## 2011-09-13 MED ORDER — ALIGN 4 MG PO CAPS: 1.0000 | ORAL_CAPSULE | Freq: Every day | ORAL | Status: DC

## 2011-09-13 MED ORDER — MESALAMINE 400 MG PO TBEC
800.0000 mg | DELAYED_RELEASE_TABLET | Freq: Two times a day (BID) | ORAL | Status: DC
  Administered 2011-09-13 – 2011-09-14 (×2): 800 mg via ORAL
  Filled 2011-09-13 (×3): qty 2

## 2011-09-13 MED ORDER — SUCRALFATE 1 G PO TABS
1.0000 g | ORAL_TABLET | Freq: Two times a day (BID) | ORAL | Status: DC
  Filled 2011-09-13 (×2): qty 1

## 2011-09-13 MED ORDER — PROPOFOL 10 MG/ML IV EMUL
INTRAVENOUS | Status: DC | PRN
  Administered 2011-09-13: 90 mg via INTRAVENOUS

## 2011-09-13 MED ORDER — MUPIROCIN 2 % EX OINT
TOPICAL_OINTMENT | CUTANEOUS | Status: AC
  Administered 2011-09-13: 13:00:00 via NASAL
  Filled 2011-09-13: qty 22

## 2011-09-13 MED ORDER — ONDANSETRON HCL 4 MG/2ML IJ SOLN
INTRAMUSCULAR | Status: DC | PRN
  Administered 2011-09-13: 4 mg via INTRAVENOUS

## 2011-09-13 MED ORDER — SULFAMETHOXAZOLE-TMP DS 800-160 MG PO TABS
1.0000 | ORAL_TABLET | Freq: Two times a day (BID) | ORAL | Status: DC
  Administered 2011-09-13 – 2011-09-14 (×2): 1 via ORAL
  Filled 2011-09-13 (×3): qty 1

## 2011-09-13 MED ORDER — TIOTROPIUM BROMIDE MONOHYDRATE 18 MCG IN CAPS
18.0000 ug | ORAL_CAPSULE | Freq: Every day | RESPIRATORY_TRACT | Status: DC
  Administered 2011-09-14: 18 ug via RESPIRATORY_TRACT
  Filled 2011-09-13: qty 5

## 2011-09-13 MED ORDER — ROCURONIUM BROMIDE 100 MG/10ML IV SOLN
INTRAVENOUS | Status: DC | PRN
  Administered 2011-09-13: 20 mg via INTRAVENOUS

## 2011-09-13 MED ORDER — ROFLUMILAST 500 MCG PO TABS
500.0000 ug | ORAL_TABLET | Freq: Every day | ORAL | Status: DC
  Filled 2011-09-13: qty 1

## 2011-09-13 MED ORDER — ZOLPIDEM TARTRATE 5 MG PO TABS: 5.0000 mg | ORAL_TABLET | Freq: Every evening | ORAL | Status: DC | PRN

## 2011-09-13 MED ORDER — ATORVASTATIN CALCIUM 10 MG PO TABS
5.0000 mg | ORAL_TABLET | ORAL | Status: DC
  Administered 2011-09-14: 10:00:00 via ORAL
  Filled 2011-09-13: qty 0.5

## 2011-09-13 MED ORDER — VANCOMYCIN HCL IN DEXTROSE 1-5 GM/200ML-% IV SOLN
INTRAVENOUS | Status: AC
  Filled 2011-09-13: qty 200

## 2011-09-13 MED ORDER — ZOLPIDEM TARTRATE 5 MG PO TABS: 5.0000 mg | ORAL_TABLET | Freq: Every day | ORAL | Status: DC

## 2011-09-13 MED ORDER — FLUTICASONE PROPIONATE 50 MCG/ACT NA SUSP
2.0000 | Freq: Every day | NASAL | Status: DC
  Administered 2011-09-14: 2 via NASAL
  Filled 2011-09-13: qty 16

## 2011-09-13 MED ORDER — OXYCODONE HCL 5 MG PO TABS
5.0000 mg | ORAL_TABLET | ORAL | Status: DC | PRN
  Administered 2011-09-13: 5 mg via ORAL
  Filled 2011-09-13: qty 1

## 2011-09-13 MED ORDER — ONDANSETRON HCL 4 MG/2ML IJ SOLN: 4.0000 mg | Freq: Once | INTRAMUSCULAR | Status: DC | PRN

## 2011-09-13 MED ORDER — METHOCARBAMOL 500 MG PO TABS
500.0000 mg | ORAL_TABLET | Freq: Four times a day (QID) | ORAL | Status: DC | PRN
  Administered 2011-09-13: 500 mg via ORAL
  Filled 2011-09-13: qty 1

## 2011-09-13 MED ORDER — VANCOMYCIN HCL IN DEXTROSE 1-5 GM/200ML-% IV SOLN
1000.0000 mg | INTRAVENOUS | Status: AC
  Administered 2011-09-13: 1000 mg via INTRAVENOUS

## 2011-09-13 MED ORDER — CHOLECALCIFEROL 10 MCG (400 UNIT) PO TABS
400.0000 [IU] | ORAL_TABLET | Freq: Every day | ORAL | Status: DC
  Administered 2011-09-13 – 2011-09-14 (×2): 400 [IU] via ORAL
  Filled 2011-09-13 (×2): qty 1

## 2011-09-13 MED ORDER — METOCLOPRAMIDE HCL 5 MG/ML IJ SOLN: 5.0000 mg | Freq: Three times a day (TID) | INTRAMUSCULAR | Status: DC | PRN

## 2011-09-13 MED ORDER — DIPHENHYDRAMINE HCL 50 MG/ML IJ SOLN
12.5000 mg | Freq: Two times a day (BID) | INTRAMUSCULAR | Status: DC
  Administered 2011-09-14 (×2): 12.5 mg via INTRAVENOUS
  Filled 2011-09-13: qty 1
  Filled 2011-09-13 (×2): qty 0.25
  Filled 2011-09-13: qty 1

## 2011-09-13 MED ORDER — CALCITONIN (SALMON) 200 UNIT/ACT NA SOLN
1.0000 | Freq: Every day | NASAL | Status: DC
  Administered 2011-09-14: 1 via NASAL
  Filled 2011-09-13: qty 3.7

## 2011-09-13 MED ORDER — METHOCARBAMOL 100 MG/ML IJ SOLN
500.0000 mg | INTRAVENOUS | Status: AC
  Filled 2011-09-13: qty 5

## 2011-09-13 MED ORDER — ADULT MULTIVITAMIN W/MINERALS CH
1.0000 | ORAL_TABLET | Freq: Every day | ORAL | Status: DC
  Administered 2011-09-13 – 2011-09-14 (×2): 1 via ORAL
  Filled 2011-09-13 (×2): qty 1

## 2011-09-13 MED ORDER — FLORA-Q PO CAPS
1.0000 | ORAL_CAPSULE | Freq: Every day | ORAL | Status: DC
  Administered 2011-09-13 – 2011-09-14 (×2): 1 via ORAL
  Filled 2011-09-13 (×2): qty 1

## 2011-09-13 MED ORDER — METHOCARBAMOL 500 MG PO TABS: 500.0000 mg | ORAL_TABLET | Freq: Four times a day (QID) | ORAL | Status: DC | PRN

## 2011-09-13 MED ORDER — ALBUTEROL SULFATE HFA 108 (90 BASE) MCG/ACT IN AERS
2.0000 | INHALATION_SPRAY | Freq: Four times a day (QID) | RESPIRATORY_TRACT | Status: DC | PRN
  Filled 2011-09-13: qty 6.7

## 2011-09-13 MED ORDER — LATANOPROST 0.005 % OP SOLN
1.0000 [drp] | Freq: Every day | OPHTHALMIC | Status: DC
  Administered 2011-09-13: 1 [drp] via OPHTHALMIC
  Filled 2011-09-13: qty 2.5

## 2011-09-13 MED ORDER — VITAMIN C 500 MG PO TABS
500.0000 mg | ORAL_TABLET | Freq: Every day | ORAL | Status: DC
  Administered 2011-09-13 – 2011-09-14 (×2): 500 mg via ORAL
  Filled 2011-09-13 (×2): qty 1

## 2011-09-13 MED ORDER — SODIUM CHLORIDE 0.9 % IV SOLN
INTRAVENOUS | Status: DC
  Administered 2011-09-13: 21:00:00 via INTRAVENOUS

## 2011-09-13 MED ORDER — PANTOPRAZOLE SODIUM 40 MG PO TBEC
40.0000 mg | DELAYED_RELEASE_TABLET | Freq: Every day | ORAL | Status: DC
Start: 2011-09-13 — End: 2011-09-14
  Administered 2011-09-14: 40 mg via ORAL
  Filled 2011-09-13: qty 1

## 2011-09-13 MED ORDER — ACETAMINOPHEN 500 MG PO TABS: 500.0000 mg | ORAL_TABLET | Freq: Four times a day (QID) | ORAL | Status: DC | PRN

## 2011-09-13 MED ORDER — DIPHENHYDRAMINE HCL 12.5 MG/5ML PO ELIX: 12.5000 mg | ORAL_SOLUTION | ORAL | Status: DC | PRN

## 2011-09-13 MED ORDER — VANCOMYCIN HCL 1000 MG IV SOLR
750.0000 mg | Freq: Two times a day (BID) | INTRAVENOUS | Status: DC
  Administered 2011-09-14 (×2): 750 mg via INTRAVENOUS
  Filled 2011-09-13 (×4): qty 750

## 2011-09-13 MED ORDER — GLYCOPYRROLATE 0.2 MG/ML IJ SOLN
INTRAMUSCULAR | Status: DC | PRN
  Administered 2011-09-13: 0.3 mg via INTRAVENOUS

## 2011-09-13 MED ORDER — SULFAMETHOXAZOLE-TMP DS 800-160 MG PO TABS: 1.0000 | ORAL_TABLET | Freq: Four times a day (QID) | ORAL | Status: DC

## 2011-09-13 MED ORDER — ARFORMOTEROL TARTRATE 15 MCG/2ML IN NEBU
15.0000 ug | INHALATION_SOLUTION | Freq: Two times a day (BID) | RESPIRATORY_TRACT | Status: DC
  Administered 2011-09-14: 15 ug via RESPIRATORY_TRACT
  Filled 2011-09-13 (×4): qty 2

## 2011-09-13 MED ORDER — HYDROCODONE-ACETAMINOPHEN 5-325 MG PO TABS
1.0000 | ORAL_TABLET | ORAL | Status: DC | PRN
  Filled 2011-09-13 (×2): qty 1

## 2011-09-13 MED ORDER — ONDANSETRON HCL 4 MG/2ML IJ SOLN: 4.0000 mg | Freq: Four times a day (QID) | INTRAMUSCULAR | Status: DC | PRN

## 2011-09-13 MED ORDER — ROFLUMILAST 500 MCG PO TABS
500.0000 ug | ORAL_TABLET | Freq: Every day | ORAL | Status: DC
  Administered 2011-09-14: 500 ug via ORAL
  Filled 2011-09-13: qty 1

## 2011-09-13 MED ORDER — METOCLOPRAMIDE HCL 5 MG PO TABS
5.0000 mg | ORAL_TABLET | Freq: Three times a day (TID) | ORAL | Status: DC | PRN
  Filled 2011-09-13: qty 2

## 2011-09-13 MED ORDER — VANCOMYCIN HCL IN DEXTROSE 1-5 GM/200ML-% IV SOLN: 1000.0000 mg | Freq: Two times a day (BID) | INTRAVENOUS | Status: DC

## 2011-09-13 MED ORDER — MORPHINE SULFATE 4 MG/ML IJ SOLN: 0.0500 mg/kg | INTRAMUSCULAR | Status: DC | PRN

## 2011-09-13 MED ORDER — HYDROCODONE-ACETAMINOPHEN 5-325 MG PO TABS
1.0000 | ORAL_TABLET | Freq: Four times a day (QID) | ORAL | Status: DC
  Administered 2011-09-14 (×3): 1 via ORAL
  Filled 2011-09-13: qty 1

## 2011-09-13 MED ORDER — METHOCARBAMOL 100 MG/ML IJ SOLN
500.0000 mg | Freq: Four times a day (QID) | INTRAVENOUS | Status: DC | PRN
  Filled 2011-09-13: qty 5

## 2011-09-13 MED ORDER — NEOSTIGMINE METHYLSULFATE 1 MG/ML IJ SOLN
INTRAMUSCULAR | Status: DC | PRN
  Administered 2011-09-13: 2 mg via INTRAVENOUS

## 2011-09-13 SURGICAL SUPPLY — 38 items
BAG DECANTER FOR FLEXI CONT (MISCELLANEOUS) IMPLANT
CLOTH BEACON ORANGE TIMEOUT ST (SAFETY) ×2 IMPLANT
COVER SURGICAL LIGHT HANDLE (MISCELLANEOUS) ×2 IMPLANT
DRAPE ORTHO SPLIT 77X108 STRL (DRAPES) ×2
DRAPE SURG ORHT 6 SPLT 77X108 (DRAPES) ×2 IMPLANT
DRAPE U-SHAPE 47X51 STRL (DRAPES) ×4 IMPLANT
DRSG ADAPTIC 3X8 NADH LF (GAUZE/BANDAGES/DRESSINGS) ×2 IMPLANT
DRSG MEPILEX BORDER 4X8 (GAUZE/BANDAGES/DRESSINGS) ×2 IMPLANT
DRSG PAD ABDOMINAL 8X10 ST (GAUZE/BANDAGES/DRESSINGS) ×2 IMPLANT
DURAPREP 26ML APPLICATOR (WOUND CARE) ×2 IMPLANT
ELECT CAUTERY BLADE 6.4 (BLADE) IMPLANT
ELECT REM PT RETURN 9FT ADLT (ELECTROSURGICAL)
ELECTRODE REM PT RTRN 9FT ADLT (ELECTROSURGICAL) IMPLANT
GLOVE BIOGEL M 8.0 STRL (GLOVE) IMPLANT
GLOVE BIOGEL PI IND STRL 8 (GLOVE) ×1 IMPLANT
GLOVE BIOGEL PI INDICATOR 8 (GLOVE) ×1
GOWN PREVENTION PLUS LG XLONG (DISPOSABLE) IMPLANT
GOWN PREVENTION PLUS XLARGE (GOWN DISPOSABLE) ×2 IMPLANT
GOWN STRL NON-REIN LRG LVL3 (GOWN DISPOSABLE) ×2 IMPLANT
HANDPIECE INTERPULSE COAX TIP (DISPOSABLE)
KIT BASIN OR (CUSTOM PROCEDURE TRAY) ×2 IMPLANT
KIT ROOM TURNOVER OR (KITS) ×2 IMPLANT
MANIFOLD NEPTUNE II (INSTRUMENTS) ×2 IMPLANT
NS IRRIG 1000ML POUR BTL (IV SOLUTION) ×2 IMPLANT
PACK TOTAL JOINT (CUSTOM PROCEDURE TRAY) ×2 IMPLANT
PAD ARMBOARD 7.5X6 YLW CONV (MISCELLANEOUS) ×4 IMPLANT
SET HNDPC FAN SPRY TIP SCT (DISPOSABLE) IMPLANT
SPONGE GAUZE 4X4 12PLY (GAUZE/BANDAGES/DRESSINGS) ×2 IMPLANT
SPONGE LAP 18X18 X RAY DECT (DISPOSABLE) IMPLANT
STAPLER VISISTAT 35W (STAPLE) ×2 IMPLANT
SUT VIC AB 1 CTB1 27 (SUTURE) ×4 IMPLANT
SUT VIC AB 2-0 CT1 27 (SUTURE) ×2
SUT VIC AB 2-0 CT1 TAPERPNT 27 (SUTURE) ×2 IMPLANT
TOWEL OR 17X24 6PK STRL BLUE (TOWEL DISPOSABLE) ×2 IMPLANT
TOWEL OR 17X26 10 PK STRL BLUE (TOWEL DISPOSABLE) ×2 IMPLANT
TUBE ANAEROBIC SPECIMEN COL (MISCELLANEOUS) IMPLANT
UNDERPAD 30X30 INCONTINENT (UNDERPADS AND DIAPERS) IMPLANT
WATER STERILE IRR 1000ML POUR (IV SOLUTION) ×2 IMPLANT

## 2011-09-13 NOTE — Anesthesia Preprocedure Evaluation (Signed)
Anesthesia Evaluation  Patient identified by MRN, date of birth, ID band Patient awake    Reviewed: Allergy & Precautions, H&P , NPO status , Patient's Chart, lab work & pertinent test results  Airway Mallampati: I TM Distance: >3 FB Neck ROM: Full    Dental  (+) Partial Upper and Dental Advisory Given   Pulmonary  breath sounds clear to auscultation        Cardiovascular Rhythm:Regular Rate:Normal     Neuro/Psych    GI/Hepatic   Endo/Other    Renal/GU      Musculoskeletal   Abdominal   Peds  Hematology   Anesthesia Other Findings   Reproductive/Obstetrics                           Anesthesia Physical Anesthesia Plan  ASA: III  Anesthesia Plan: General   Post-op Pain Management:    Induction: Intravenous  Airway Management Planned: Oral ETT  Additional Equipment:   Intra-op Plan:   Post-operative Plan: Extubation in OR  Informed Consent: I have reviewed the patients History and Physical, chart, labs and discussed the procedure including the risks, benefits and alternatives for the proposed anesthesia with the patient or authorized representative who has indicated his/her understanding and acceptance.   Dental advisory given  Plan Discussed with: CRNA, Anesthesiologist and Surgeon  Anesthesia Plan Comments:         Anesthesia Quick Evaluation

## 2011-09-13 NOTE — Preoperative (Signed)
Beta Blockers   Reason not to administer Beta Blockers:Not Applicable 

## 2011-09-13 NOTE — Anesthesia Procedure Notes (Signed)
Procedure Name: Intubation Date/Time: 09/13/2011 4:02 PM Performed by: Jefm Miles E Pre-anesthesia Checklist: Patient identified, Emergency Drugs available, Suction available, Timeout performed and Patient being monitored Patient Re-evaluated:Patient Re-evaluated prior to inductionOxygen Delivery Method: Circle system utilized Preoxygenation: Pre-oxygenation with 100% oxygen Intubation Type: IV induction Ventilation: Mask ventilation without difficulty Laryngoscope Size: Mac and 3 Grade View: Grade I Tube type: Oral Tube size: 7.0 mm Number of attempts: 1 Airway Equipment and Method: Stylet Placement Confirmation: ETT inserted through vocal cords under direct vision,  breath sounds checked- equal and bilateral and positive ETCO2 Secured at: 20 cm Tube secured with: Tape Dental Injury: Teeth and Oropharynx as per pre-operative assessment

## 2011-09-13 NOTE — Transfer of Care (Signed)
Immediate Anesthesia Transfer of Care Note  Patient: Bridget Duncan  Procedure(s) Performed: Procedure(s) (LRB): IRRIGATION AND DEBRIDEMENT HIP (Right)  Patient Location: PACU  Anesthesia Type: General  Level of Consciousness: awake, alert  and oriented  Airway & Oxygen Therapy: Patient Spontanous Breathing and Patient connected to nasal cannula oxygen  Post-op Assessment: Report given to PACU RN  Post vital signs: Reviewed and stable  Complications: No apparent anesthesia complications

## 2011-09-13 NOTE — Progress Notes (Addendum)
ANTIBIOTIC CONSULT NOTE - INITIAL  Pharmacy Consult for vancomycin Indication: s/p I & D of chronically infected R hip  Allergies  Allergen Reactions  . Vancomycin Hives    Patient Measurements: Height: 4\' 9"  (144.8 cm) Weight: 146 lb (66.225 kg) IBW/kg (Calculated) : 38.6    Vital Signs: Temp: 97.2 F (36.2 C) (03/26 1820) Temp src: Oral (03/26 1820) BP: 166/62 mmHg (03/26 1820) Pulse Rate: 88  (03/26 1820) Intake/Output from previous day:   Intake/Output from this shift:    Labs:  Basename 09/13/11 1329  WBC 6.7  HGB 10.8*  PLT 301  LABCREA --  CREATININE 0.95   Estimated Creatinine Clearance: 34.5 ml/min (by C-G formula based on Cr of 0.95). No results found for this basename: VANCOTROUGH:2,VANCOPEAK:2,VANCORANDOM:2,GENTTROUGH:2,GENTPEAK:2,GENTRANDOM:2,TOBRATROUGH:2,TOBRAPEAK:2,TOBRARND:2,AMIKACINPEAK:2,AMIKACINTROU:2,AMIKACIN:2, in the last 72 hours   Microbiology: Recent Results (from the past 720 hour(s))  SURGICAL PCR SCREEN     Status: Normal   Collection Time   09/13/11  1:30 PM      Component Value Range Status Comment   MRSA, PCR NEGATIVE  NEGATIVE  Final    Staphylococcus aureus NEGATIVE  NEGATIVE  Final     Medical History: Past Medical History  Diagnosis Date  . Arthritis   . Osteoporosis   . Hypertension   . Paresthesia   . 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 Shortness of breath     with activity  . Asthma   . GERD (gastroesophageal reflux disease)   . COPD (chronic obstructive pulmonary disease)     sees Dr. Loraine Leriche Donor  . Emphysema     hx of  . Ulcerative colitis     hx of    Medications:  Prescriptions prior to admission  Medication Sig Dispense Refill  . acetaminophen (TYLENOL) 500 MG tablet Take 500 mg by mouth every 6 (six) hours as needed. For pain      . albuterol (PROVENTIL HFA;VENTOLIN HFA) 108 (90 BASE) MCG/ACT inhaler Inhale 2 puffs into the lungs every 6 (six) hours as needed. For wheezing       . albuterol (PROVENTIL) (2.5 MG/3ML) 0.083% nebulizer solution Take 2.5 mg by nebulization every 6 (six) hours as needed. For wheezing      . arformoterol (BROVANA) 15 MCG/2ML NEBU Take 15 mcg by nebulization 2 (two) times daily.        Marland Kitchen atorvastatin (LIPITOR) 10 MG tablet Take 5 mg by mouth 3 (three) times a week. On Monday, Wednesday, and Friday.      . calcitonin, salmon, (MIACALCIN/FORTICAL) 200 UNIT/ACT nasal spray Place 1 spray into the nose daily.      . calcium-vitamin D (OSCAL WITH D) 500-200 MG-UNIT per tablet Take 1 tablet by mouth daily.        . cholecalciferol (VITAMIN D) 400 UNITS TABS Take 400 Units by mouth daily.      . Cranberry Extract 250 MG TABS Take 1 tablet by mouth daily as needed. For bladder      . diclofenac (FLECTOR) 1.3 % PTCH Place 1 patch onto the skin at bedtime.       . diclofenac sodium (VOLTAREN) 1 % GEL Apply 1 application topically daily.       . DULoxetine (CYMBALTA) 60 MG capsule Take 60 mg by mouth daily.      . fish oil-omega-3 fatty acids 1000 MG capsule Take 1 g by mouth daily.      . fluticasone (FLONASE) 50 MCG/ACT nasal spray Place 2 sprays  into the nose daily.      Marland Kitchen HYDROcodone-acetaminophen (NORCO) 5-325 MG per tablet Take 1 tablet by mouth 4 (four) times daily. For pain      . latanoprost (XALATAN) 0.005 % ophthalmic solution 1 drop at bedtime.        . lidocaine (LIDODERM) 5 % Place 1 patch onto the skin daily. Remove & Discard patch within 12 hours or as directed by MD       . meloxicam (MOBIC) 7.5 MG tablet Take 7.5 mg by mouth 2 (two) times daily.      . mesalamine (ASACOL) 400 MG EC tablet Take 800 mg by mouth 2 (two) times daily.      . methocarbamol (ROBAXIN) 500 MG tablet Take 500 mg by mouth 4 (four) times daily as needed. For spasms      . Multiple Vitamin (MULITIVITAMIN WITH MINERALS) TABS Take 1 tablet by mouth daily.      . pantoprazole (PROTONIX) 40 MG tablet Take 40 mg by mouth daily.      . Probiotic Product (ALIGN) 4 MG CAPS  Take 1 capsule by mouth daily.      . roflumilast (DALIRESP) 500 MCG TABS tablet Take 500 mcg by mouth daily.        . sucralfate (CARAFATE) 1 G tablet Take 1 g by mouth 2 (two) times daily.      Marland Kitchen sulfamethoxazole-trimethoprim (BACTRIM DS) 800-160 MG per tablet Take 1 tablet by mouth 4 (four) times daily. For hip infection      . tiotropium (SPIRIVA) 18 MCG inhalation capsule Place 18 mcg into inhaler and inhale daily.      . vitamin C (ASCORBIC ACID) 500 MG tablet Take 500 mg by mouth daily.      Marland Kitchen zolpidem (AMBIEN) 5 MG tablet Take 5 mg by mouth at bedtime.       Assessment: Very pleasant 78 yo WF s/p I &D of chronically infected R hip - known chronic prostetic joint infection. She has had 14 surgeries on this hip.  She states she was on Bactrim DS once or twice at day prior to admission (not 4 x day as listed in med hx).  She rec'd vanc 1 gram preop at 1551.  During previous admisssion she was started on vancomycin 1000 mg IV q24 and trough was 10.2 mcg/ml and dose was increased to 750 mg IV q12h.   Goal of Therapy:  Vancomycin trough level 15-20 mcg/ml  Plan:  1. Vancomycin 1000 mg x1 at 1551 followed by vancomycin 750 mg IV q12h 2. Pt with hx of hives and redness with vancomycin, will pre-medicate each dose with benadryl 12.5 mg IV 3. Check steady-state trough if vanc to continue long-term 4. I changed Bactrim DS to BID from QID. Thanks Herby Abraham, Pharm.D. [phone] 09/13/2011 8:31 PM   Len Childs T 09/13/2011,8:27 PM

## 2011-09-13 NOTE — Brief Op Note (Signed)
09/13/2011  4:43 PM  PATIENT:  Bridget Duncan  77 y.o. female  PRE-OPERATIVE DIAGNOSIS:  Chronic Infection Right Hip  POST-OPERATIVE DIAGNOSIS:  chronic infection right hip  PROCEDURE:  Procedure(s) (LRB): IRRIGATION AND DEBRIDEMENT HIP (Right)  SURGEON:  Surgeon(s) and Role:    * Kathryne Hitch, MD - Primary  PHYSICIAN ASSISTANT:   ASSISTANTS: none   ANESTHESIA:   general  EBL:  Total I/O In: -  Out: 50 [Blood:50]  BLOOD ADMINISTERED:none  DRAINS: none   LOCAL MEDICATIONS USED:  NONE  SPECIMEN:  No Specimen  DISPOSITION OF SPECIMEN:  N/A  COUNTS:  YES  TOURNIQUET:  * No tourniquets in log *  DICTATION: .Other Dictation: Dictation Number [id number]  PLAN OF CARE: Admit for overnight observation  PATIENT DISPOSITION:  PACU - hemodynamically stable.   Delay start of Pharmacological VTE agent (>24hrs) due to surgical blood loss or risk of bleeding: not applicable

## 2011-09-13 NOTE — H&P (Signed)
Bridget Duncan is an 78 y.o. female.   Chief Complaint:   Right hip wound drainage and known chronic prosthetic joint infection. HPI:   78 yo female with a chronic right hip prosthetic joint infection that is being suppressed by chronic antibiotics.  She recently underwent an I&D of her hip and has had continued drainage since then.  We will proceed to the OR today for a repeat I&D.  Past Medical History  Diagnosis Date  . Arthritis   . Osteoporosis   . Hypertension   . Paresthesia   . 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 Shortness of breath     with activity  . Asthma   . GERD (gastroesophageal reflux disease)   . COPD (chronic obstructive pulmonary disease)     sees Dr. Loraine Leriche Donor  . Emphysema     hx of  . Ulcerative colitis     hx of    Past Surgical History  Procedure Date  . Other surgical history     14 surgeries to r hip/femur (post fall with fx and chronic infections)  . Incision and drainage hip 08/09/2011    Procedure: IRRIGATION AND DEBRIDEMENT HIP;  Surgeon: Kathryne Hitch, MD;  Location: [facility] OR;  Service: Orthopedics;  Laterality: Right;  Irrigation and debridement right hip  . Joint replacement     r thr 2006. 14 surgeries on hip    History reviewed. No pertinent family history. Social History:  reports that she quit smoking about 10 years ago. She does not have any smokeless tobacco history on file. She reports that she does not drink alcohol or use illicit drugs.  Allergies:  Allergies  Allergen Reactions  . Vancomycin Hives    Medications Prior to Admission  Medication Dose Route Frequency Provider Last Rate Last Dose  . mupirocin ointment (BACTROBAN) 2 %           . vancomycin (VANCOCIN) 1 GM/200ML IVPB           . vancomycin (VANCOCIN) IVPB 1000 mg/200 mL premix  1,000 mg Intravenous 60 min Pre-Op Kathryne Hitch, MD       Medications Prior to Admission  Medication Sig Dispense Refill  . acetaminophen  (TYLENOL) 500 MG tablet Take 500 mg by mouth every 6 (six) hours as needed. For pain      . albuterol (PROVENTIL HFA;VENTOLIN HFA) 108 (90 BASE) MCG/ACT inhaler Inhale 2 puffs into the lungs every 6 (six) hours as needed. For wheezing      . albuterol (PROVENTIL) (2.5 MG/3ML) 0.083% nebulizer solution Take 2.5 mg by nebulization every 6 (six) hours as needed. For wheezing      . arformoterol (BROVANA) 15 MCG/2ML NEBU Take 15 mcg by nebulization 2 (two) times daily.        Marland Kitchen atorvastatin (LIPITOR) 10 MG tablet Take 5 mg by mouth 3 (three) times a week. On Monday, Wednesday, and Friday.      . calcitonin, salmon, (MIACALCIN/FORTICAL) 200 UNIT/ACT nasal spray Place 1 spray into the nose daily.      . calcium-vitamin D (OSCAL WITH D) 500-200 MG-UNIT per tablet Take 1 tablet by mouth daily.        . cholecalciferol (VITAMIN D) 400 UNITS TABS Take 400 Units by mouth daily.      . Cranberry Extract 250 MG TABS Take 1 tablet by mouth daily as needed. For bladder      . diclofenac (FLECTOR) 1.3 %  PTCH Place 1 patch onto the skin at bedtime.       . diclofenac sodium (VOLTAREN) 1 % GEL Apply 1 application topically daily.       . DULoxetine (CYMBALTA) 60 MG capsule Take 60 mg by mouth daily.      . fish oil-omega-3 fatty acids 1000 MG capsule Take 1 g by mouth daily.      . fluticasone (FLONASE) 50 MCG/ACT nasal spray Place 2 sprays into the nose daily.      Marland Kitchen HYDROcodone-acetaminophen (NORCO) 5-325 MG per tablet Take 1 tablet by mouth 4 (four) times daily. For pain      . latanoprost (XALATAN) 0.005 % ophthalmic solution 1 drop at bedtime.        . lidocaine (LIDODERM) 5 % Place 1 patch onto the skin daily. Remove & Discard patch within 12 hours or as directed by MD       . meloxicam (MOBIC) 7.5 MG tablet Take 7.5 mg by mouth 2 (two) times daily.      . mesalamine (ASACOL) 400 MG EC tablet Take 800 mg by mouth 2 (two) times daily.      . methocarbamol (ROBAXIN) 500 MG tablet Take 500 mg by mouth 4 (four)  times daily as needed. For spasms      . Multiple Vitamin (MULITIVITAMIN WITH MINERALS) TABS Take 1 tablet by mouth daily.      . pantoprazole (PROTONIX) 40 MG tablet Take 40 mg by mouth daily.      . Probiotic Product (ALIGN) 4 MG CAPS Take 1 capsule by mouth daily.      . roflumilast (DALIRESP) 500 MCG TABS tablet Take 500 mcg by mouth daily.        . sucralfate (CARAFATE) 1 G tablet Take 1 g by mouth 2 (two) times daily.      Marland Kitchen sulfamethoxazole-trimethoprim (BACTRIM DS) 800-160 MG per tablet Take 1 tablet by mouth 4 (four) times daily. For hip infection      . tiotropium (SPIRIVA) 18 MCG inhalation capsule Place 18 mcg into inhaler and inhale daily.      . vitamin C (ASCORBIC ACID) 500 MG tablet Take 500 mg by mouth daily.      Marland Kitchen zolpidem (AMBIEN) 5 MG tablet Take 5 mg by mouth at bedtime.        Results for orders placed during the hospital encounter of 09/13/11 (from the past 48 hour(s))  BASIC METABOLIC PANEL     Status: Abnormal   Collection Time   09/13/11  1:29 PM      Component Value Range Comment   Sodium 134 (*) 135 - 145 (mEq/L)    Potassium 4.5  3.5 - 5.1 (mEq/L)    Chloride 98  96 - 112 (mEq/L)    CO2 30  19 - 32 (mEq/L)    Glucose, Bld 83  70 - 99 (mg/dL)    BUN 19  6 - 23 (mg/dL)    Creatinine, Ser 1.61  0.50 - 1.10 (mg/dL)    Calcium 09.6  8.4 - 10.5 (mg/dL)    GFR calc non Af Amer 53 (*) >90 (mL/min)    GFR calc Af Amer 62 (*) >90 (mL/min)   CBC     Status: Abnormal   Collection Time   09/13/11  1:29 PM      Component Value Range Comment   WBC 6.7  4.0 - 10.5 (K/uL)    RBC 3.53 (*) 3.87 - 5.11 (MIL/uL)  Hemoglobin 10.8 (*) 12.0 - 15.0 (g/dL)    HCT 78.4 (*) 69.6 - 46.0 (%)    MCV 95.5  78.0 - 100.0 (fL)    MCH 30.6  26.0 - 34.0 (pg)    MCHC 32.0  30.0 - 36.0 (g/dL)    RDW 29.5  28.4 - 13.2 (%)    Platelets 301  150 - 400 (K/uL)    Dg Chest 2 View  09/13/2011  *RADIOLOGY REPORT*  Clinical Data: Drainage of right hip.  CHEST - 2 VIEW  Comparison: 08/09/2011   Findings: Heart size upper normal.  Atherosclerotic aorta with tortuosity.  Negative for heart failure.  Negative for pneumonia or mass lesion.  Numerous thoracic compression fractures are present which appear chronic.  Per CMS PQRS reporting requirements (PQRS Measure 24): Given the patient's age of greater than 50 and the fracture site (hip, distal radius, or spine), the patient should be tested for osteoporosis using DXA, and the appropriate treatment considered based on the DXA results.  IMPRESSION: No active cardiopulmonary disease.  Multiple thoracic fractures.  Original Report Authenticated By: Camelia Phenes, M.D.    Review of Systems  All other systems reviewed and are negative.    Blood pressure 188/77, pulse 76, temperature 98.2 F (36.8 C), temperature source Oral, resp. rate 18, height 4\' 9"  (1.448 m), weight 66.225 kg (146 lb), SpO2 94.00%. Physical Exam  Musculoskeletal:       Right hip: She exhibits deformity.   She has drainage from her previous incision.  Assessment/Plan To the OR today for a repeat I&D of her right hip.  Kathryne Hitch 09/13/2011, 2:57 PM

## 2011-09-13 NOTE — Anesthesia Postprocedure Evaluation (Signed)
  Anesthesia Post-op Note  Patient: Bridget Duncan  Procedure(s) Performed: Procedure(s) (LRB): IRRIGATION AND DEBRIDEMENT HIP (Right)  Patient Location: PACU  Anesthesia Type: General  Level of Consciousness: awake and alert   Airway and Oxygen Therapy: Patient Spontanous Breathing and Patient connected to nasal cannula oxygen  Post-op Pain: mild  Post-op Assessment: Post-op Vital signs reviewed, Patient's Cardiovascular Status Stable, Respiratory Function Stable, Patent Airway and No signs of Nausea or vomiting  Post-op Vital Signs: Reviewed and stable  Complications: No apparent anesthesia complications

## 2011-09-14 ENCOUNTER — Encounter (HOSPITAL_COMMUNITY): Payer: Self-pay | Admitting: Orthopaedic Surgery

## 2011-09-14 NOTE — Progress Notes (Signed)
Patient ID: Bridget Duncan, female   DOB: October 13, 1925, 78 y.o.   MRN: [id number] Doing well.  Not acute changes overnight. Plan to discharge to home this afternoon after another dose of IV antibiotics.

## 2011-09-14 NOTE — Op Note (Signed)
NAMEMarland Duncan  KEEGAN, BENSCH NO.:  [id number]  MEDICAL RECORD NO.:  [id number]  LOCATION:  5022                         FACILITY:  [facility]  PHYSICIAN:  Vanita Panda. Magnus Ivan, M.D.DATE OF BIRTH:  November 20, 1925  DATE OF PROCEDURE:  09/13/2011 DATE OF DISCHARGE:                              OPERATIVE REPORT   PREOPERATIVE DIAGNOSIS:  Chronic right hip periprosthetic joint infection.  POSTOPERATIVE DIAGNOSIS:  Chronic right hip periprosthetic joint infection.  PROCEDURE:  Repeat irrigation and debridement of right hip joint.  SURGEON:  Vanita Panda. Magnus Ivan, MD  ANESTHESIA:  General.  ANTIBIOTICS:  1 g of vancomycin.  BLOOD LOSS:  Less than 100 mL.  COMPLICATIONS:  None.  INDICATIONS:  Ms. Mensch is 78 year old well known to me.  She has multiple irrigation and debridements of her right chronic hip infection with retained prosthesis.  This has been going on for years, but the family wishes just to suppress this infection and says that the patient due to her age and the fact that she would have to have this joint removed and she could not safely have her reimplant from time to time over the years.  She does come back for an I and D, we just did this several weeks ago and placed a Stimulan beads, antibiotic beads of the wound, but she still continued to have chronic drainage.  She recommended a repeat irrigation and debridement.  The risks and benefits of this were explained to her in detail and she does wish to proceed surgery.  PROCEDURE DESCRIPTION:  After informed consent was obtained, appropriate right hip was marked.  She was brought to the operating room and placed supine on the operating table.  General anesthesia was then obtained. She was turned into the lateral decubitus position with the right operative hip up.  Her right hip was then prepped and draped with DuraPrep and sterile drapes.  Time-out was called and she was identified as the correct  patient and correct right hip.  I then made and incision directly over a previous incision and dissected right down to the prosthesis.  There was a large fluid collection that I irrigated and suctioned out.  I removed some of the previous Stimulan beads that were still unresolved.  I then copiously irrigated the wound and the tissue superficial and deep with 3 liters of normal saline solution.  I then closed deeply in layers with #1 Vicryl all the way up to subcutaneous tissue with 2-0 Vicryl and interrupted sutures and staples on the skin. Well-padded sterile dressing was applied.  She was awakened, extubated and taken to recovery room in stable condition.  Postoperatively, she will be admitted for just 23-hour observation with discharge tomorrow.     Vanita Panda. Magnus Ivan, M.D.     CYB/MEDQ  D:  09/13/2011  T:  09/14/2011  Job:  [id number]

## 2011-09-15 NOTE — Discharge Summary (Signed)
Patient ID: Bridget Duncan MRN: [id number] DOB/AGE: [birth year] 78 y.o.  Admit date: 09/13/2011 Discharge date: 09/15/2011  Admission Diagnoses:  Active Problems:  * No active hospital problems. *    Discharge Diagnoses:  Same  Past Medical History  Diagnosis Date  . Arthritis   . Osteoporosis   . Hypertension   . Paresthesia   . 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 Shortness of breath     with activity  . Asthma   . GERD (gastroesophageal reflux disease)   . COPD (chronic obstructive pulmonary disease)     sees Dr. Loraine Leriche Donor  . Emphysema     hx of  . Ulcerative colitis     hx of    Surgeries: Procedure(s): IRRIGATION AND DEBRIDEMENT HIP on 09/13/2011   Consultants:    Discharged Condition: Improved  Hospital Course: Bridget Duncan is an 78 y.o. female who was admitted 09/13/2011 for operative treatment of<principal problem not specified>. Patient has severe unremitting pain that affects sleep, daily activities, and work/hobbies. After pre-op clearance the patient was taken to the operating room on 09/13/2011 and underwent  Procedure(s): IRRIGATION AND DEBRIDEMENT HIP.    Patient was given perioperative antibiotics: Anti-infectives     Start     Dose/Rate Route Frequency Ordered Stop   09/14/11 0600   vancomycin (VANCOCIN) 750 mg in sodium chloride 0.9 % 150 mL IVPB  Status:  Discontinued        750 mg 150 mL/hr over 60 Minutes Intravenous Every 12 hours 09/13/11 2026 09/14/11 2141   09/13/11 2200   sulfamethoxazole-trimethoprim (BACTRIM DS) 800-160 MG per tablet 1 tablet  Status:  Discontinued        1 tablet Oral 4 times daily 09/13/11 1948 09/13/11 2022   09/13/11 2200   sulfamethoxazole-trimethoprim (BACTRIM DS) 800-160 MG per tablet 1 tablet  Status:  Discontinued        1 tablet Oral Every 12 hours 09/13/11 2023 09/14/11 2141   09/13/11 2000   vancomycin (VANCOCIN) IVPB 1000 mg/200 mL premix  Status:  Discontinued        1,000 mg 200 mL/hr over 60 Minutes Intravenous Every 12 hours 09/13/11 1948 09/13/11 2024   09/13/11 1347   vancomycin (VANCOCIN) 1 GM/200ML IVPB     Comments: GALLMAN, KATHIE JEAN: cabinet override         09/13/11 1347 09/14/11 0159   09/13/11 1311   vancomycin (VANCOCIN) IVPB 1000 mg/200 mL premix        1,000 mg 200 mL/hr over 60 Minutes Intravenous 60 min pre-op 09/13/11 1311 09/13/11 1551           Patient was given sequential compression devices, early ambulation, and chemoprophylaxis to prevent DVT.  Patient benefited maximally from hospital stay and there were no complications.    Recent vital signs: Patient Vitals for the past 24 hrs:  BP Temp Pulse Resp SpO2  09/14/11 1438 - - - - 95 %  09/14/11 1300 119/58 mmHg 98.9 F (37.2 C) 89  18  91 %     Recent laboratory studies:  Basename 09/13/11 1329  WBC 6.7  HGB 10.8*  HCT 33.7*  PLT 301  NA 134*  K 4.5  CL 98  CO2 30  BUN 19  CREATININE 0.95  GLUCOSE 83  INR --  CALCIUM 10.1     Discharge Medications:   Medication List  As of 09/15/2011 11:13 AM   STOP taking these medications  DULoxetine 60 MG capsule         TAKE these medications         acetaminophen 500 MG tablet   Commonly known as: TYLENOL   Take 500 mg by mouth every 6 (six) hours as needed. For pain      albuterol 108 (90 BASE) MCG/ACT inhaler   Commonly known as: PROVENTIL HFA;VENTOLIN HFA   Inhale 2 puffs into the lungs every 6 (six) hours as needed. For wheezing      albuterol (2.5 MG/3ML) 0.083% nebulizer solution   Commonly known as: PROVENTIL   Take 2.5 mg by nebulization every 6 (six) hours as needed. For wheezing      ALIGN 4 MG Caps   Take 1 capsule by mouth daily.      arformoterol 15 MCG/2ML Nebu   Commonly known as: BROVANA   Take 15 mcg by nebulization 2 (two) times daily.      atorvastatin 10 MG tablet   Commonly known as: LIPITOR   Take 5 mg by mouth 3 (three) times a week. On Monday, Wednesday, and  Friday.      calcitonin (salmon) 200 UNIT/ACT nasal spray   Commonly known as: MIACALCIN/FORTICAL   Place 1 spray into the nose daily.      calcium-vitamin D 500-200 MG-UNIT per tablet   Commonly known as: OSCAL WITH D   Take 1 tablet by mouth daily.      cholecalciferol 400 UNITS Tabs   Commonly known as: VITAMIN D   Take 400 Units by mouth daily.      Cranberry Extract 250 MG Tabs   Take 1 tablet by mouth daily as needed. For bladder      diclofenac 1.3 % Ptch   Commonly known as: FLECTOR   Place 1 patch onto the skin at bedtime.      diclofenac sodium 1 % Gel   Commonly known as: VOLTAREN   Apply 1 application topically daily.      fish oil-omega-3 fatty acids 1000 MG capsule   Take 1 g by mouth daily.      fluticasone 50 MCG/ACT nasal spray   Commonly known as: FLONASE   Place 2 sprays into the nose daily.      HYDROcodone-acetaminophen 5-325 MG per tablet   Commonly known as: NORCO   Take 1 tablet by mouth 4 (four) times daily. For pain      latanoprost 0.005 % ophthalmic solution   Commonly known as: XALATAN   1 drop at bedtime.      lidocaine 5 %   Commonly known as: LIDODERM   Place 1 patch onto the skin daily. Remove & Discard patch within 12 hours or as directed by MD      meloxicam 7.5 MG tablet   Commonly known as: MOBIC   Take 7.5 mg by mouth 2 (two) times daily.      mesalamine 400 MG EC tablet   Commonly known as: ASACOL   Take 800 mg by mouth 2 (two) times daily.      methocarbamol 500 MG tablet   Commonly known as: ROBAXIN   Take 500 mg by mouth 4 (four) times daily as needed. For spasms      mulitivitamin with minerals Tabs   Take 1 tablet by mouth daily.      pantoprazole 40 MG tablet   Commonly known as: PROTONIX   Take 40 mg by mouth daily.      roflumilast 500 MCG  Tabs tablet   Commonly known as: DALIRESP   Take 500 mcg by mouth daily.      sucralfate 1 G tablet   Commonly known as: CARAFATE   Take 1 g by mouth 2 (two) times  daily.      sulfamethoxazole-trimethoprim 800-160 MG per tablet   Commonly known as: BACTRIM DS   Take 1 tablet by mouth 4 (four) times daily. For hip infection      tiotropium 18 MCG inhalation capsule   Commonly known as: SPIRIVA   Place 18 mcg into inhaler and inhale daily.      vitamin C 500 MG tablet   Commonly known as: ASCORBIC ACID   Take 500 mg by mouth daily.      zolpidem 5 MG tablet   Commonly known as: AMBIEN   Take 5 mg by mouth at bedtime.            Diagnostic Studies: Dg Chest 2 View  09/13/2011  *RADIOLOGY REPORT*  Clinical Data: Drainage of right hip.  CHEST - 2 VIEW  Comparison: 08/09/2011  Findings: Heart size upper normal.  Atherosclerotic aorta with tortuosity.  Negative for heart failure.  Negative for pneumonia or mass lesion.  Numerous thoracic compression fractures are present which appear chronic.  Per CMS PQRS reporting requirements (PQRS Measure 24): Given the patient's age of greater than 50 and the fracture site (hip, distal radius, or spine), the patient should be tested for osteoporosis using DXA, and the appropriate treatment considered based on the DXA results.  IMPRESSION: No active cardiopulmonary disease.  Multiple thoracic fractures.  Original Report Authenticated By: Camelia Phenes, M.D.    Disposition: 01-Home or Self Care  Discharge Orders    Future Orders Please Complete By Expires   Diet - low sodium heart healthy      Call MD / Call 911      Comments:   If you experience chest pain or shortness of breath, CALL 911 and be transported to the hospital emergency room.  If you develope a fever above 101 F, pus (white drainage) or increased drainage or redness at the wound, or calf pain, call your surgeon's office.   Constipation Prevention      Comments:   Drink plenty of fluids.  Prune juice may be helpful.  You may use a stool softener, such as Colace (over the counter) 100 mg twice a day.  Use MiraLax (over the counter) for constipation  as needed.   Increase activity slowly as tolerated      Weight Bearing as taught in Physical Therapy      Comments:   Use a walker or crutches as instructed.   Discharge instructions      Comments:   Keep your right hip incision clean and dry. Place a new dry dressing daily as needed.   Discharge patient            Signed: Kathryne Hitch 09/15/2011, 11:13 AM

## 2011-11-18 ENCOUNTER — Encounter (HOSPITAL_COMMUNITY): Payer: Self-pay | Admitting: Pharmacy Technician

## 2011-11-18 ENCOUNTER — Inpatient Hospital Stay (HOSPITAL_COMMUNITY)
Admission: AD | Admit: 2011-11-18 | Discharge: 2011-11-24 | DRG: 481 | Disposition: A | Discharge status: Skilled Nursing Facility | Payer: Medicare Other | Source: Ambulatory Visit | Attending: Orthopaedic Surgery | Admitting: Orthopaedic Surgery

## 2011-11-18 DIAGNOSIS — M810 Age-related osteoporosis without current pathological fracture: Secondary | ICD-10-CM | POA: Diagnosis present

## 2011-11-18 DIAGNOSIS — Z96649 Presence of unspecified artificial hip joint: Secondary | ICD-10-CM

## 2011-11-18 DIAGNOSIS — M129 Arthropathy, unspecified: Secondary | ICD-10-CM | POA: Diagnosis present

## 2011-11-18 DIAGNOSIS — I129 Hypertensive chronic kidney disease with stage 1 through stage 4 chronic kidney disease, or unspecified chronic kidney disease: Secondary | ICD-10-CM | POA: Diagnosis present

## 2011-11-18 DIAGNOSIS — Y831 Surgical operation with implant of artificial internal device as the cause of abnormal reaction of the patient, or of later complication, without mention of misadventure at the time of the procedure: Secondary | ICD-10-CM | POA: Diagnosis present

## 2011-11-18 DIAGNOSIS — Z87891 Personal history of nicotine dependence: Secondary | ICD-10-CM

## 2011-11-18 DIAGNOSIS — T8451XA Infection and inflammatory reaction due to internal right hip prosthesis, initial encounter: Secondary | ICD-10-CM

## 2011-11-18 DIAGNOSIS — J449 Chronic obstructive pulmonary disease, unspecified: Secondary | ICD-10-CM | POA: Diagnosis present

## 2011-11-18 DIAGNOSIS — K219 Gastro-esophageal reflux disease without esophagitis: Secondary | ICD-10-CM | POA: Diagnosis present

## 2011-11-18 DIAGNOSIS — Z8614 Personal history of Methicillin resistant Staphylococcus aureus infection: Secondary | ICD-10-CM

## 2011-11-18 DIAGNOSIS — K5190 Ulcerative colitis, unspecified, without complications: Secondary | ICD-10-CM | POA: Diagnosis present

## 2011-11-18 DIAGNOSIS — M86659 Other chronic osteomyelitis, unspecified thigh: Secondary | ICD-10-CM | POA: Diagnosis present

## 2011-11-18 DIAGNOSIS — T8450XA Infection and inflammatory reaction due to unspecified internal joint prosthesis, initial encounter: Principal | ICD-10-CM | POA: Diagnosis present

## 2011-11-18 DIAGNOSIS — N182 Chronic kidney disease, stage 2 (mild): Secondary | ICD-10-CM | POA: Diagnosis present

## 2011-11-18 DIAGNOSIS — J4489 Other specified chronic obstructive pulmonary disease: Secondary | ICD-10-CM | POA: Diagnosis present

## 2011-11-18 DIAGNOSIS — T8459XA Infection and inflammatory reaction due to other internal joint prosthesis, initial encounter: Secondary | ICD-10-CM

## 2011-11-18 DIAGNOSIS — Z79899 Other long term (current) drug therapy: Secondary | ICD-10-CM

## 2011-11-18 HISTORY — DX: Myoneural disorder, unspecified: G70.9

## 2011-11-18 LAB — CBC
HCT: 34.5 % — ABNORMAL LOW (ref 36.0–46.0)
HCT: 34.1 % — ABNORMAL LOW (ref 36.0–46.0)
Hemoglobin: 10.8 g/dL — ABNORMAL LOW (ref 12.0–15.0)
Hemoglobin: 10.7 g/dL — ABNORMAL LOW (ref 12.0–15.0)
MCH: 30 pg (ref 26.0–34.0)
MCHC: 31.3 g/dL (ref 30.0–36.0)
MCV: 95.8 fL (ref 78.0–100.0)
RBC: 3.6 MIL/uL — ABNORMAL LOW (ref 3.87–5.11)
RDW: 14 % (ref 11.5–15.5)
WBC: 7 10*3/uL (ref 4.0–10.5)

## 2011-11-18 LAB — BASIC METABOLIC PANEL
BUN: 26 mg/dL — ABNORMAL HIGH (ref 6–23)
BUN: 25 mg/dL — ABNORMAL HIGH (ref 6–23)
CO2: 31 mEq/L (ref 19–32)
Chloride: 97 mEq/L (ref 96–112)
Chloride: 97 mEq/L (ref 96–112)
GFR calc Af Amer: 68 mL/min — ABNORMAL LOW (ref >90)
GFR calc non Af Amer: 55 mL/min — ABNORMAL LOW (ref >90)
Glucose, Bld: 75 mg/dL (ref 70–99)
Potassium: 4.5 mEq/L (ref 3.5–5.1)
Potassium: 4.5 mEq/L (ref 3.5–5.1)
Sodium: 138 mEq/L (ref 135–145)

## 2011-11-18 MED ORDER — SODIUM CHLORIDE 0.9 % IV SOLN
INTRAVENOUS | Status: DC
  Administered 2011-11-18: 50 mL/h via INTRAVENOUS
  Administered 2011-11-19 – 2011-11-21 (×2): via INTRAVENOUS

## 2011-11-18 MED ORDER — ALBUTEROL SULFATE (5 MG/ML) 0.5% IN NEBU
INHALATION_SOLUTION | RESPIRATORY_TRACT | Status: AC
  Filled 2011-11-18: qty 0.5

## 2011-11-18 MED ORDER — OXYCODONE HCL 5 MG PO TABS
10.0000 mg | ORAL_TABLET | ORAL | Status: DC | PRN
  Administered 2011-11-19 – 2011-11-23 (×10): 10 mg via ORAL
  Filled 2011-11-18 (×10): qty 2

## 2011-11-18 MED ORDER — CHLORHEXIDINE GLUCONATE 4 % EX LIQD
60.0000 mL | Freq: Once | CUTANEOUS | Status: DC
  Filled 2011-11-18: qty 60

## 2011-11-18 MED ORDER — ATORVASTATIN CALCIUM 10 MG PO TABS
5.0000 mg | ORAL_TABLET | ORAL | Status: DC
  Administered 2011-11-19 – 2011-11-23 (×3): 5 mg via ORAL
  Filled 2011-11-18 (×6): qty 0.5

## 2011-11-18 MED ORDER — ALBUTEROL SULFATE (5 MG/ML) 0.5% IN NEBU
2.5000 mg | INHALATION_SOLUTION | Freq: Four times a day (QID) | RESPIRATORY_TRACT | Status: DC
  Administered 2011-11-19 – 2011-11-22 (×12): 2.5 mg via RESPIRATORY_TRACT
  Filled 2011-11-18 (×10): qty 0.5

## 2011-11-18 MED ORDER — ALIGN 4 MG PO CAPS: 1.0000 | ORAL_CAPSULE | Freq: Every day | ORAL | Status: DC

## 2011-11-18 MED ORDER — TIOTROPIUM BROMIDE MONOHYDRATE 18 MCG IN CAPS
18.0000 ug | ORAL_CAPSULE | Freq: Every day | RESPIRATORY_TRACT | Status: DC
  Administered 2011-11-19 – 2011-11-22 (×4): 18 ug via RESPIRATORY_TRACT
  Filled 2011-11-18: qty 5

## 2011-11-18 MED ORDER — CALCIUM CARBONATE-VITAMIN D 500-200 MG-UNIT PO TABS
1.0000 | ORAL_TABLET | Freq: Every day | ORAL | Status: DC
  Administered 2011-11-18 – 2011-11-24 (×7): 1 via ORAL
  Filled 2011-11-18 (×7): qty 1

## 2011-11-18 MED ORDER — ARFORMOTEROL TARTRATE 15 MCG/2ML IN NEBU
15.0000 ug | INHALATION_SOLUTION | Freq: Two times a day (BID) | RESPIRATORY_TRACT | Status: DC
  Administered 2011-11-19 – 2011-11-24 (×10): 15 ug via RESPIRATORY_TRACT
  Filled 2011-11-18 (×13): qty 2

## 2011-11-18 MED ORDER — LATANOPROST 0.005 % OP SOLN
1.0000 [drp] | Freq: Every day | OPHTHALMIC | Status: DC
  Administered 2011-11-18 – 2011-11-23 (×6): 1 [drp] via OPHTHALMIC
  Filled 2011-11-18: qty 2.5

## 2011-11-18 MED ORDER — MESALAMINE 400 MG PO TBEC
800.0000 mg | DELAYED_RELEASE_TABLET | Freq: Three times a day (TID) | ORAL | Status: DC
  Administered 2011-11-18 – 2011-11-24 (×16): 800 mg via ORAL
  Filled 2011-11-18 (×19): qty 2

## 2011-11-18 MED ORDER — PANTOPRAZOLE SODIUM 40 MG PO TBEC
40.0000 mg | DELAYED_RELEASE_TABLET | Freq: Every day | ORAL | Status: DC
  Administered 2011-11-18 – 2011-11-23 (×6): 40 mg via ORAL
  Filled 2011-11-18 (×6): qty 1

## 2011-11-18 MED ORDER — ONDANSETRON HCL 4 MG PO TABS: 4.0000 mg | ORAL_TABLET | Freq: Three times a day (TID) | ORAL | Status: DC | PRN

## 2011-11-18 MED ORDER — DORZOLAMIDE HCL 2 % OP SOLN
1.0000 [drp] | Freq: Two times a day (BID) | OPHTHALMIC | Status: DC
  Administered 2011-11-19 – 2011-11-24 (×7): 1 [drp] via OPHTHALMIC
  Filled 2011-11-18: qty 10

## 2011-11-18 MED ORDER — ALBUTEROL SULFATE HFA 108 (90 BASE) MCG/ACT IN AERS: 2.0000 | INHALATION_SPRAY | Freq: Four times a day (QID) | RESPIRATORY_TRACT | Status: DC | PRN

## 2011-11-18 MED ORDER — CHOLECALCIFEROL 10 MCG (400 UNIT) PO TABS
400.0000 [IU] | ORAL_TABLET | Freq: Every day | ORAL | Status: DC
  Administered 2011-11-18 – 2011-11-24 (×7): 400 [IU] via ORAL
  Filled 2011-11-18 (×7): qty 1

## 2011-11-18 MED ORDER — OXYCODONE-ACETAMINOPHEN 5-325 MG PO TABS
2.0000 | ORAL_TABLET | ORAL | Status: AC | PRN
  Administered 2011-11-18: 2 via ORAL
  Administered 2011-11-19: 1 via ORAL
  Administered 2011-11-20: 2 via ORAL
  Filled 2011-11-18: qty 2
  Filled 2011-11-18: qty 1
  Filled 2011-11-18: qty 2

## 2011-11-18 MED ORDER — ADULT MULTIVITAMIN W/MINERALS CH
1.0000 | ORAL_TABLET | Freq: Every day | ORAL | Status: DC
  Administered 2011-11-18 – 2011-11-24 (×7): 1 via ORAL
  Filled 2011-11-18 (×7): qty 1

## 2011-11-18 MED ORDER — SACCHAROMYCES BOULARDII 250 MG PO CAPS
250.0000 mg | ORAL_CAPSULE | Freq: Every day | ORAL | Status: DC
  Administered 2011-11-19 – 2011-11-24 (×6): 250 mg via ORAL
  Filled 2011-11-18 (×7): qty 1

## 2011-11-18 MED ORDER — METHOCARBAMOL 500 MG PO TABS
500.0000 mg | ORAL_TABLET | Freq: Four times a day (QID) | ORAL | Status: DC | PRN
  Administered 2011-11-20 – 2011-11-22 (×3): 500 mg via ORAL
  Filled 2011-11-18 (×3): qty 1

## 2011-11-18 MED ORDER — VITAMIN C 500 MG PO TABS
500.0000 mg | ORAL_TABLET | Freq: Every day | ORAL | Status: DC
  Administered 2011-11-18 – 2011-11-24 (×7): 500 mg via ORAL
  Filled 2011-11-18 (×7): qty 1

## 2011-11-18 MED ORDER — ZOLPIDEM TARTRATE 5 MG PO TABS
5.0000 mg | ORAL_TABLET | Freq: Every day | ORAL | Status: DC
  Administered 2011-11-18 – 2011-11-23 (×6): 5 mg via ORAL
  Filled 2011-11-18 (×6): qty 1

## 2011-11-18 NOTE — Progress Notes (Signed)
No answer on home or moble phone. I left message on both phones with information regarding NPO, time to arrive , check in in ED.  Take Protonix and  Take Zofran if needed. Use eye gtts  and inhalers.  I asked that she bring Albuterol inhaler in to hospital with her.

## 2011-11-18 NOTE — H&P (Signed)
Bridget Duncan is an 78 y.o. female.   Chief Complaint:   Right hip pain with drainage HPI:   78 yo with a chronic right hip prosthetic joint infection.  She has been on chronic antibiotics and has had multiple I&Ds in the past.  She now has recurrent drainage and needs a repeat I&D.  Past Medical History  Diagnosis Date  . Arthritis   . Osteoporosis   . Hypertension   . Paresthesia   . 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 Shortness of breath     with activity  . Asthma   . GERD (gastroesophageal reflux disease)   . COPD (chronic obstructive pulmonary disease)     sees Dr. Loraine Leriche Donor  . Emphysema     hx of  . Ulcerative colitis     hx of    Past Surgical History  Procedure Date  . Other surgical history     14 surgeries to r hip/femur (post fall with fx and chronic infections)  . Incision and drainage hip 08/09/2011    Procedure: IRRIGATION AND DEBRIDEMENT HIP;  Surgeon: Kathryne Hitch, MD;  Location: [facility] OR;  Service: Orthopedics;  Laterality: Right;  Irrigation and debridement right hip  . Joint replacement     r thr 2006. 14 surgeries on hip  . Incision and drainage hip 09/13/2011    Procedure: IRRIGATION AND DEBRIDEMENT HIP;  Surgeon: Kathryne Hitch, MD;  Location: [facility] OR;  Service: Orthopedics;  Laterality: Right;  Right hip irrigation and debridement    No family history on file. Social History:  reports that she quit smoking about 10 years ago. She does not have any smokeless tobacco history on file. She reports that she does not drink alcohol or use illicit drugs.  Allergies:  Allergies  Allergen Reactions  . Vancomycin Hives    Medications Prior to Admission  Medication Sig Dispense Refill  . albuterol (PROVENTIL HFA;VENTOLIN HFA) 108 (90 BASE) MCG/ACT inhaler Inhale 2 puffs into the lungs every 6 (six) hours as needed. For wheezing      . albuterol (PROVENTIL) (2.5 MG/3ML) 0.083% nebulizer solution Take 2.5 mg by  nebulization every 6 (six) hours as needed. For wheezing      . arformoterol (BROVANA) 15 MCG/2ML NEBU Take 15 mcg by nebulization 2 (two) times daily.        Marland Kitchen atorvastatin (LIPITOR) 10 MG tablet Take 5 mg by mouth 3 (three) times a week. On Monday, Wednesday, and Friday.      . calcium-vitamin D (OSCAL WITH D) 500-200 MG-UNIT per tablet Take 1 tablet by mouth daily.        . cholecalciferol (VITAMIN D) 400 UNITS TABS Take 400 Units by mouth daily.      . Cranberry Extract 250 MG TABS Take 1 tablet by mouth daily as needed. For bladder      . diclofenac (FLECTOR) 1.3 % PTCH Place 1 patch onto the skin at bedtime.       . diclofenac sodium (VOLTAREN) 1 % GEL Apply 1 application topically at bedtime as needed. As needed for muscle pain. Shoulder.      . diphenhydramine-acetaminophen (TYLENOL PM) 25-500 MG TABS Take 1 tablet by mouth at bedtime as needed. As needed for sleep. Patient takes with Ambien.      . dorzolamide (TRUSOPT) 2 % ophthalmic solution Place 1 drop into both eyes 2 (two) times daily.      Marland Kitchen doxycycline (VIBRA-TABS)  100 MG tablet Take 100 mg by mouth 2 (two) times daily.      . fish oil-omega-3 fatty acids 1000 MG capsule Take 1 g by mouth daily.      Marland Kitchen HYDROcodone-acetaminophen (NORCO) 5-325 MG per tablet Take 1 tablet by mouth 4 (four) times daily. For pain      . latanoprost (XALATAN) 0.005 % ophthalmic solution Place 1 drop into both eyes at bedtime.       . lidocaine (LIDODERM) 5 % Place 1 patch onto the skin daily. Remove & Discard patch within 12 hours or as directed by MD       . meloxicam (MOBIC) 7.5 MG tablet Take 7.5 mg by mouth 2 (two) times daily.      . mesalamine (ASACOL) 400 MG EC tablet Take 800 mg by mouth 3 (three) times daily.       . methocarbamol (ROBAXIN) 500 MG tablet Take 500 mg by mouth 4 (four) times daily as needed. For spasms      . Multiple Vitamin (MULITIVITAMIN WITH MINERALS) TABS Take 1 tablet by mouth daily.      . Multiple Vitamins-Minerals (ICAPS  PO) Take 1 capsule by mouth daily.      . ondansetron (ZOFRAN) 4 MG tablet Take 4 mg by mouth every 8 (eight) hours as needed. As needed for nausea.      . pantoprazole (PROTONIX) 40 MG tablet Take 40 mg by mouth daily.      . Probiotic Product (ALIGN) 4 MG CAPS Take 1 capsule by mouth daily.      Marland Kitchen tiotropium (SPIRIVA) 18 MCG inhalation capsule Place 18 mcg into inhaler and inhale daily.      . vitamin C (ASCORBIC ACID) 500 MG tablet Take 500 mg by mouth daily.      Marland Kitchen zolpidem (AMBIEN) 5 MG tablet Take 5 mg by mouth at bedtime.        Results for orders placed during the hospital encounter of 11/18/11 (from the past 48 hour(s))  CBC     Status: Abnormal   Collection Time   11/18/11  7:07 PM      Component Value Range Comment   WBC 6.6  4.0 - 10.5 (K/uL)    RBC 3.60 (*) 3.87 - 5.11 (MIL/uL)    Hemoglobin 10.8 (*) 12.0 - 15.0 (g/dL)    HCT 16.1 (*) 09.6 - 46.0 (%)    MCV 95.8  78.0 - 100.0 (fL)    MCH 30.0  26.0 - 34.0 (pg)    MCHC 31.3  30.0 - 36.0 (g/dL)    RDW 04.5  40.9 - 81.1 (%)    Platelets 243  150 - 400 (K/uL)    No results found.  Review of Systems  All other systems reviewed and are negative.    Blood pressure 159/95, pulse 68, temperature 98.6 F (37 C), resp. rate 20, SpO2 95.00%. Physical Exam  Constitutional: She is oriented to person, place, and time. She appears well-developed and well-nourished.  HENT:  Head: Normocephalic and atraumatic.  Eyes: EOM are normal. Pupils are equal, round, and reactive to light.  Neck: Normal range of motion. Neck supple.  Cardiovascular: Normal rate and regular rhythm.   Respiratory: Effort normal and breath sounds normal.  GI: Soft. Bowel sounds are normal.  Musculoskeletal:       Right hip: She exhibits swelling.  Neurological: She is oriented to person, place, and time.  Skin: Skin is warm and dry.  Psychiatric: She has  a normal mood and affect.   Right hip has a wound with purulent drainage  Assessment/Plan Right  Chronic Prosthetic Hip joint Infection 1) to the OR tomorrow for an irrigation and debridement of her right hip with the hopes of retaining the hardware and continuing to suppress her infection 2) NPO after MN tonight 3) Consent 4)  Will likely get PICC line placed during this hospitalization and consult the infectious disease specialists for antibiotic recommendations in light of her recurrent infection.  Kathryne Hitch 11/18/2011, 7:40 PM

## 2011-11-19 ENCOUNTER — Inpatient Hospital Stay (HOSPITAL_COMMUNITY): Payer: Medicare Other | Admitting: Anesthesiology

## 2011-11-19 ENCOUNTER — Encounter (HOSPITAL_COMMUNITY)
Admission: AD | Disposition: A | Discharge status: Skilled Nursing Facility | Payer: Self-pay | Source: Ambulatory Visit | Attending: Orthopaedic Surgery

## 2011-11-19 ENCOUNTER — Ambulatory Visit (HOSPITAL_COMMUNITY): Admission: RE | Admit: 2011-11-19 | Payer: Medicare Other | Source: Ambulatory Visit | Admitting: Orthopaedic Surgery

## 2011-11-19 ENCOUNTER — Encounter (HOSPITAL_COMMUNITY): Payer: Self-pay | Admitting: Anesthesiology

## 2011-11-19 HISTORY — PX: INCISION AND DRAINAGE HIP: SHX1801

## 2011-11-19 SURGERY — IRRIGATION AND DEBRIDEMENT HIP
Anesthesia: General | Site: Hip | Laterality: Right | Wound class: Clean Contaminated

## 2011-11-19 MED ORDER — LACTATED RINGERS IV SOLN
INTRAVENOUS | Status: DC | PRN
  Administered 2011-11-19: 07:00:00 via INTRAVENOUS

## 2011-11-19 MED ORDER — SODIUM CHLORIDE 0.9 % IR SOLN
Status: DC | PRN
  Administered 2011-11-19: 3000 mL

## 2011-11-19 MED ORDER — DIPHENHYDRAMINE HCL 25 MG PO CAPS
25.0000 mg | ORAL_CAPSULE | ORAL | Status: DC
  Administered 2011-11-19 – 2011-11-23 (×5): 25 mg via ORAL
  Filled 2011-11-19 (×8): qty 1

## 2011-11-19 MED ORDER — NEOSTIGMINE METHYLSULFATE 1 MG/ML IJ SOLN
INTRAMUSCULAR | Status: DC | PRN
  Administered 2011-11-19: 2 mg via INTRAVENOUS
  Administered 2011-11-19: 3 mg via INTRAVENOUS

## 2011-11-19 MED ORDER — ONDANSETRON HCL 4 MG/2ML IJ SOLN
INTRAMUSCULAR | Status: DC | PRN
  Administered 2011-11-19: 4 mg via INTRAVENOUS

## 2011-11-19 MED ORDER — CLINDAMYCIN PHOSPHATE 600 MG/50ML IV SOLN
INTRAVENOUS | Status: DC | PRN
  Administered 2011-11-19: 600 mg via INTRAVENOUS

## 2011-11-19 MED ORDER — FENTANYL CITRATE 0.05 MG/ML IJ SOLN
INTRAMUSCULAR | Status: DC | PRN
  Administered 2011-11-19: 25 ug via INTRAVENOUS
  Administered 2011-11-19 (×2): 50 ug via INTRAVENOUS
  Administered 2011-11-19: 25 ug via INTRAVENOUS

## 2011-11-19 MED ORDER — HYDROMORPHONE HCL PF 1 MG/ML IJ SOLN
0.2500 mg | INTRAMUSCULAR | Status: DC | PRN
  Administered 2011-11-19 (×3): 0.5 mg via INTRAVENOUS

## 2011-11-19 MED ORDER — ROCURONIUM BROMIDE 100 MG/10ML IV SOLN
INTRAVENOUS | Status: DC | PRN
  Administered 2011-11-19: 35 mg via INTRAVENOUS

## 2011-11-19 MED ORDER — LIDOCAINE HCL (CARDIAC) 20 MG/ML IV SOLN
INTRAVENOUS | Status: DC | PRN
  Administered 2011-11-19: 80 mg via INTRAVENOUS

## 2011-11-19 MED ORDER — CLINDAMYCIN PHOSPHATE 600 MG/4ML IJ SOLN
INTRAMUSCULAR | Status: AC
  Filled 2011-11-19: qty 4

## 2011-11-19 MED ORDER — PROPOFOL 10 MG/ML IV EMUL
INTRAVENOUS | Status: DC | PRN
  Administered 2011-11-19: 80 mg via INTRAVENOUS

## 2011-11-19 MED ORDER — VANCOMYCIN HCL IN DEXTROSE 1-5 GM/200ML-% IV SOLN
1000.0000 mg | INTRAVENOUS | Status: DC
  Administered 2011-11-19 – 2011-11-23 (×5): 1000 mg via INTRAVENOUS
  Filled 2011-11-19 (×5): qty 200

## 2011-11-19 MED ORDER — DEXTROSE 5 % IV SOLN
INTRAVENOUS | Status: AC
  Filled 2011-11-19: qty 50

## 2011-11-19 MED ORDER — GLYCOPYRROLATE 0.2 MG/ML IJ SOLN
INTRAMUSCULAR | Status: DC | PRN
  Administered 2011-11-19: 0.3 mg via INTRAVENOUS
  Administered 2011-11-19: 0.4 mg via INTRAVENOUS

## 2011-11-19 MED ORDER — HYDROMORPHONE HCL PF 1 MG/ML IJ SOLN
INTRAMUSCULAR | Status: AC
  Filled 2011-11-19: qty 1

## 2011-11-19 MED ORDER — HETASTARCH-ELECTROLYTES 6 % IV SOLN
INTRAVENOUS | Status: DC | PRN
  Administered 2011-11-19: 08:00:00 via INTRAVENOUS

## 2011-11-19 SURGICAL SUPPLY — 44 items
BAG DECANTER FOR FLEXI CONT (MISCELLANEOUS) IMPLANT
CLOTH BEACON ORANGE TIMEOUT ST (SAFETY) ×2 IMPLANT
COVER SURGICAL LIGHT HANDLE (MISCELLANEOUS) ×2 IMPLANT
DRAPE ORTHO SPLIT 77X108 STRL (DRAPES) ×2
DRAPE SURG 17X23 STRL (DRAPES) ×2 IMPLANT
DRAPE SURG ORHT 6 SPLT 77X108 (DRAPES) ×2 IMPLANT
DRAPE U-SHAPE 47X51 STRL (DRAPES) ×2 IMPLANT
DRSG ADAPTIC 3X8 NADH LF (GAUZE/BANDAGES/DRESSINGS) IMPLANT
DRSG PAD ABDOMINAL 8X10 ST (GAUZE/BANDAGES/DRESSINGS) IMPLANT
DRSG VAC ATS MED SENSATRAC (GAUZE/BANDAGES/DRESSINGS) ×2 IMPLANT
DURAPREP 26ML APPLICATOR (WOUND CARE) ×2 IMPLANT
ELECT CAUTERY BLADE 6.4 (BLADE) IMPLANT
ELECT REM PT RETURN 9FT ADLT (ELECTROSURGICAL) ×2
ELECTRODE REM PT RTRN 9FT ADLT (ELECTROSURGICAL) ×1 IMPLANT
GLOVE BIOGEL M 8.0 STRL (GLOVE) ×4 IMPLANT
GLOVE BIOGEL PI IND STRL 8 (GLOVE) ×2 IMPLANT
GLOVE BIOGEL PI INDICATOR 8 (GLOVE) ×2
GOWN PREVENTION PLUS LG XLONG (DISPOSABLE) IMPLANT
GOWN PREVENTION PLUS XLARGE (GOWN DISPOSABLE) ×6 IMPLANT
GOWN STRL NON-REIN LRG LVL3 (GOWN DISPOSABLE) IMPLANT
HANDPIECE INTERPULSE COAX TIP (DISPOSABLE) ×1
KIT BASIN OR (CUSTOM PROCEDURE TRAY) ×2 IMPLANT
KIT ROOM TURNOVER OR (KITS) ×2 IMPLANT
MANIFOLD NEPTUNE II (INSTRUMENTS) ×2 IMPLANT
NS IRRIG 1000ML POUR BTL (IV SOLUTION) IMPLANT
PACK TOTAL JOINT (CUSTOM PROCEDURE TRAY) ×2 IMPLANT
PAD ARMBOARD 7.5X6 YLW CONV (MISCELLANEOUS) ×6 IMPLANT
PAD NEG PRESSURE SENSATRAC (MISCELLANEOUS) ×2 IMPLANT
SET HNDPC FAN SPRY TIP SCT (DISPOSABLE) ×1 IMPLANT
SPONGE GAUZE 4X4 12PLY (GAUZE/BANDAGES/DRESSINGS) ×2 IMPLANT
SPONGE LAP 18X18 X RAY DECT (DISPOSABLE) IMPLANT
STAPLER VISISTAT 35W (STAPLE) IMPLANT
SUT ETHILON 2 0 PSLX (SUTURE) ×2 IMPLANT
SUT VIC AB 0 CT1 27 (SUTURE) ×1
SUT VIC AB 0 CT1 27XBRD ANBCTR (SUTURE) ×1 IMPLANT
SUT VIC AB 1 CTB1 27 (SUTURE) ×2 IMPLANT
SUT VIC AB 2-0 CT1 27 (SUTURE) ×1
SUT VIC AB 2-0 CT1 TAPERPNT 27 (SUTURE) ×1 IMPLANT
SWAB COLLECTION DEVICE MRSA (MISCELLANEOUS) ×2 IMPLANT
TOWEL OR 17X24 6PK STRL BLUE (TOWEL DISPOSABLE) ×2 IMPLANT
TOWEL OR 17X26 10 PK STRL BLUE (TOWEL DISPOSABLE) ×2 IMPLANT
TUBE ANAEROBIC SPECIMEN COL (MISCELLANEOUS) ×2 IMPLANT
UNDERPAD 30X30 INCONTINENT (UNDERPADS AND DIAPERS) ×2 IMPLANT
WATER STERILE IRR 1000ML POUR (IV SOLUTION) ×2 IMPLANT

## 2011-11-19 NOTE — Anesthesia Postprocedure Evaluation (Signed)
  Anesthesia Post-op Note  Patient: Bridget Duncan  Procedure(s) Performed: Procedure(s) (LRB): IRRIGATION AND DEBRIDEMENT HIP (Right)  Patient Location: PACU  Anesthesia Type: General  Level of Consciousness: awake  Airway and Oxygen Therapy: Patient Spontanous Breathing  Post-op Pain: mild  Post-op Assessment: Post-op Vital signs reviewed  Post-op Vital Signs: Reviewed  Complications: No apparent anesthesia complications

## 2011-11-19 NOTE — Transfer of Care (Signed)
Immediate Anesthesia Transfer of Care Note  Patient: Bridget Duncan  Procedure(s) Performed: Procedure(s) (LRB): IRRIGATION AND DEBRIDEMENT HIP (Right)  Patient Location: PACU  Anesthesia Type: General  Level of Consciousness: awake, alert  and oriented  Airway & Oxygen Therapy: Patient Spontanous Breathing and Patient connected to nasal cannula oxygen  Post-op Assessment: Report given to PACU RN, Post -op Vital signs reviewed and stable, Patient moving all extremities and Patient able to stick tongue midline  Post vital signs: Reviewed and stable  Complications: No apparent anesthesia complications

## 2011-11-19 NOTE — Anesthesia Procedure Notes (Signed)
Procedure Name: Intubation Date/Time: 11/19/2011 7:59 AM Performed by: Julianne Rice K Pre-anesthesia Checklist: Emergency Drugs available, Patient identified, Timeout performed, Suction available and Patient being monitored Patient Re-evaluated:Patient Re-evaluated prior to inductionOxygen Delivery Method: Circle system utilized Preoxygenation: Pre-oxygenation with 100% oxygen Intubation Type: IV induction Ventilation: Mask ventilation without difficulty Laryngoscope Size: Miller and 2 Grade View: Grade I Tube size: 7.0 mm Number of attempts: 1 Airway Equipment and Method: Stylet Placement Confirmation: positive ETCO2,  ETT inserted through vocal cords under direct vision and breath sounds checked- equal and bilateral Secured at: 22 cm Tube secured with: Tape Dental Injury: Teeth and Oropharynx as per pre-operative assessment

## 2011-11-19 NOTE — Preoperative (Signed)
Beta Blockers   Reason not to administer Beta Blockers:Not Applicable 

## 2011-11-19 NOTE — Progress Notes (Signed)
ANTIBIOTIC CONSULT NOTE - INITIAL  Pharmacy Consult:  Vancomycin Indication:  prosthetic right hip joint chronic infection   Allergies  Allergen Reactions  . Vancomycin Hives    Patient Measurements: Height / Weight from March 2013 145 cm , 66kg   Vital Signs: Temp: 97.2 F (36.2 C) (06/01 0930) BP: 152/57 mmHg (06/01 1000) Pulse Rate: 58  (06/01 1000) Intake/Output from previous day: 05/31 0701 - 06/01 0700 In: 40 [I.V.:40] Out: -  Intake/Output from this shift: Total I/O In: 850 [I.V.:700; IV Piggyback:150] Out: 250 [Urine:200; Blood:50]  Labs:  [facility] 11/18/11 2030 11/18/11 1907  WBC 7.0 6.6  HGB 10.7* 10.8*  PLT 242 243  LABCREA -- --  CREATININE 0.88 0.93   The CrCl is unknown because both a height and weight (above a minimum accepted value) are required for this calculation. No results found for this basename: VANCOTROUGH:2,VANCOPEAK:2,VANCORANDOM:2,GENTTROUGH:2,GENTPEAK:2,GENTRANDOM:2,TOBRATROUGH:2,TOBRAPEAK:2,TOBRARND:2,AMIKACINPEAK:2,AMIKACINTROU:2,AMIKACIN:2, in the last 72 hours   Microbiology: Recent Results (from the past 720 hour(s))  SURGICAL PCR SCREEN     Status: Normal   Collection Time   11/18/11  7:13 PM      Component Value Range Status Comment   MRSA, PCR NEGATIVE  NEGATIVE  Final    Staphylococcus aureus NEGATIVE  NEGATIVE  Final     Medical History: Past Medical History  Diagnosis Date  . Arthritis   . Osteoporosis   . Hypertension   . Paresthesia   . 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 Shortness of breath     with activity  . Asthma   . GERD (gastroesophageal reflux disease)   . COPD (chronic obstructive pulmonary disease)     sees Dr. Loraine Leriche Donor  . Emphysema     hx of  . Ulcerative colitis     hx of       Assessment: 47 YOF with 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  Patient admitted with right hip pain with drainage, s/p  I&D today 11/19/11.  Pharmacy consulted to manage vancomycin therapy on this patient with mild CKD.  Spoke to MD regarding vanc causing hives and it is decided to proceed with therapy for patient's history of MRSA infection and hives not being a true allergic reaction.  Benadryl will be given prior to vanc administration.   Goal of Therapy:  Vancomycin trough level 15-20 mcg/ml   Plan:  - Vancomycin 1gm IV Q24H - Benadryl 25mg  PO daily prior to vanc therapy - Monitor micro data, renal function - F/U update height and weight - F/U VTE prophylaxis     Disa Riedlinger D. Laney Potash, PharmD, BCPS Pager:  [phone] 11/19/2011, 11:11 AM

## 2011-11-19 NOTE — Anesthesia Preprocedure Evaluation (Addendum)
Anesthesia Evaluation  Patient identified by MRN, date of birth, ID band Patient awake    Reviewed: Allergy & Precautions, H&P , NPO status , Patient's Chart, lab work & pertinent test results  Airway Mallampati: I TM Distance: >3 FB Neck ROM: Full    Dental  (+) Partial Upper   Pulmonary shortness of breath and with exertion, asthma , COPD COPD inhaler, former smoker breath sounds clear to auscultation        Cardiovascular hypertension, Pt. on medications Rhythm:Regular Rate:Normal     Neuro/Psych negative neurological ROS  negative psych ROS   GI/Hepatic Neg liver ROS, PUD, GERD-  Medicated and Controlled,  Endo/Other  negative endocrine ROS  Renal/GU Renal InsufficiencyRenal disease     Musculoskeletal negative musculoskeletal ROS (+)   Abdominal   Peds  Hematology negative hematology ROS (+)   Anesthesia Other Findings   Reproductive/Obstetrics                        Anesthesia Physical Anesthesia Plan  ASA: III  Anesthesia Plan: General   Post-op Pain Management:    Induction: Intravenous  Airway Management Planned: Oral ETT  Additional Equipment:   Intra-op Plan:   Post-operative Plan: Extubation in OR  Informed Consent: I have reviewed the patients History and Physical, chart, labs and discussed the procedure including the risks, benefits and alternatives for the proposed anesthesia with the patient or authorized representative who has indicated his/her understanding and acceptance.   Dental advisory given  Plan Discussed with: Anesthesiologist and Surgeon  Anesthesia Plan Comments:         Anesthesia Quick Evaluation

## 2011-11-19 NOTE — Op Note (Signed)
NAMEMarland Kitchen  MEKIAH, WAHLER NO.:  [id number]  MEDICAL RECORD NO.:  [id number]  LOCATION:  5015                         FACILITY:  [facility]  PHYSICIAN:  Vanita Panda. Magnus Ivan, M.D.DATE OF BIRTH:  02-25-1926  DATE OF PROCEDURE:  11/19/2011 DATE OF DISCHARGE:                              OPERATIVE REPORT   PREOPERATIVE DIAGNOSIS:  Chronic right hip prosthetic joint infection.  POSTOPERATIVE DIAGNOSIS:  Chronic right hip prosthetic joint infection.  PROCEDURE: 1. Irrigation and debridement of right hip superficial deep tissue and     joint. 2. Removal of retained loose cable right hip.  FINDINGS:  No gross purulence.  CULTURES:  Pending.  FLUID POCKET:  Encountered.  SURGEON:  Vanita Panda. Magnus Ivan, MD  ANESTHESIA:  General.  BLOOD LOSS:  Less than 100 mL.  COMPLICATIONS:  None.  INDICATIONS:  Bridget Duncan is an 78 year old female with a chronic right hip joint infection for which she has had multiple operations over the years.  She is on suppressive antibiotics, but since her last I and D, has had persistent drainage from a small wound.  We have not been able to get the drainage to stop.  She has had a normal white count and has been on a Bactrim double strength for many years and switched to doxycycline recently.  Due to the continued drainage of her hips, I said to bring her into the hospital for at least a serial irrigation and debridement for another time, we then placing an incisional VAC over the wound, getting a PICC line placed in, and likely an Infectious Disease consult to see what else she may need to be on.  The risks and benefits of this were explained to her and her family and they do wish first to retain the implants still because without it she cannot walk at all and would never be able to put an implant back in her.  PROCEDURE DESCRIPTION:  After informed consent was obtained, appropriate right hip was marked.  She was brought to the  operating room and placed supine on the operating table.  General anesthesia was then obtained. She was then turned into the lateral decubitus position, and the incision on the side of her hip was draped off with DuraPrep and sterile drapes.  Time-out was called and she was identified as the correct patient, correct right hip.  I then made an incision directly over the hip and previous incisions where she had a small punctate open and dissected down the joint.  There was some serosanguineous fluid that we sent for culture.  She was then given clindamycin.  Again, she has been on antibiotics for a long period of  time.  I did find a cable that was loose that seem to be irritating the soft tissues, so I cut the cable and removed this in its entirety.  I then thoroughly irrigated the joint with normal saline solution using pulsatile lavage, and did not need to remove much necrotic tissue at all.  I then closed in layers some deep all the way up to the superficial and as well as closing the skin.  I then placed a small wound VAC at a low setting over the  incision itself to help dried up.  She was awakened, extubated and taken to recovery room in stable condition.  All final counts were correct.  There were no complications noted.     Vanita Panda. Magnus Ivan, M.D.     CYB/MEDQ  D:  11/19/2011  T:  11/19/2011  Job:  [id number]

## 2011-11-20 NOTE — Progress Notes (Signed)
Subjective: Pt stable pain controlled   Objective: Vital signs in last 24 hours: Temp:  [97.9 F (36.6 C)-101.6 F (38.7 C)] 101.6 F (38.7 C) (06/02 0601) Pulse Rate:  [58-87] 87  (06/02 0601) Resp:  [14-19] 18  (06/02 0601) BP: (130-154)/(51-70) 130/58 mmHg (06/02 0601) SpO2:  [63 %-100 %] 96 % (06/02 0601) Weight:  [66 kg (145 lb 8.1 oz)] 66 kg (145 lb 8.1 oz) (06/01 1100)  Intake/Output from previous day: 06/01 0701 - 06/02 0700 In: 970 [P.O.:120; I.V.:700; IV Piggyback:150] Out: 250 [Urine:200; Blood:50] Intake/Output this shift:    Exam:  Dorsiflexion/Plantar flexion intact  Labs:  Basename 11/18/11 2030 11/18/11 1907  HGB 10.7* 10.8*    Basename 11/18/11 2030 11/18/11 1907  WBC 7.0 6.6  RBC 3.57* 3.60*  HCT 34.1* 34.5*  PLT 242 243    Basename 11/18/11 2030 11/18/11 1907  NA 136 138  K 4.5 4.5  CL 97 97  CO2 31 31  BUN 25* 26*  CREATININE 0.88 0.93  GLUCOSE 108* 75  CALCIUM 9.4 9.4   No results found for this basename: LABPT:2,INR:2 in the last 72 hours  Assessment/Plan: Pt stable - vss - vac in place - cxs pending - mobilize with PT   Shawana Knoch SCOTT 11/20/2011, 9:40 AM

## 2011-11-21 ENCOUNTER — Encounter (HOSPITAL_COMMUNITY): Payer: Self-pay | Admitting: General Practice

## 2011-11-21 DIAGNOSIS — T8450XA Infection and inflammatory reaction due to unspecified internal joint prosthesis, initial encounter: Principal | ICD-10-CM

## 2011-11-21 DIAGNOSIS — Y831 Surgical operation with implant of artificial internal device as the cause of abnormal reaction of the patient, or of later complication, without mention of misadventure at the time of the procedure: Secondary | ICD-10-CM

## 2011-11-21 DIAGNOSIS — Z96649 Presence of unspecified artificial hip joint: Secondary | ICD-10-CM

## 2011-11-21 LAB — WOUND CULTURE

## 2011-11-21 MED ORDER — SODIUM CHLORIDE 0.9 % IJ SOLN
10.0000 mL | INTRAMUSCULAR | Status: DC | PRN
  Administered 2011-11-22 – 2011-11-24 (×2): 10 mL

## 2011-11-21 MED ORDER — ENOXAPARIN SODIUM 30 MG/0.3ML ~~LOC~~ SOLN
30.0000 mg | Freq: Two times a day (BID) | SUBCUTANEOUS | Status: DC
  Administered 2011-11-21 – 2011-11-24 (×7): 30 mg via SUBCUTANEOUS
  Filled 2011-11-21 (×10): qty 0.3

## 2011-11-21 NOTE — Consult Note (Signed)
Date of Admission:  11/18/2011  Date of Consult:  11/21/2011  Reason for Consult: Chronic Osteomyelitis R hip Referring Physician: Magnus Ivan  Impression/Recommendation Chronic Osteomyelitis R hip Would -  Have GI eval  Then start vancomycin, start po rifampin 300mg  qday. Comment- explained to pt and family utility of rifampin in sessile organisms, penetrating slime layers. Pt's family (and I) would like to have her UC under control before giving her IV anbx and possibly causing flare.  Thanks for this interesting consult,  Bridget Duncan is an 78 y.o. female.  HPI: 78 yo F with hx of previous C diff (2010), ulcerative colitis and R hip replacement (2004) and chronic R hip infection. She has since had 15 surgeries to her R hip. Her Cx are notable for MRSA 09-2008 and in 2007 per her family. She has recently been on bactrim since 2010 then doxycycline since march 2013 for suppressive rx.  She returns 11-18-11 with continued difficulties with her hip, continued d/c from her wound. She has had hip wound d/c since February. Mostly bloody, occas clear, cloudy, no foul smell. She underwent I & D on 11-19-11 and removal of a loose cable. Cx (-). VAC placed.  States she broke out in hives with Vancomycin once, but has since tolerated courses. Doesn't recall taking rifampin.   Past Medical History  Diagnosis Date  . Arthritis   . Osteoporosis   . Hypertension   . Paresthesia   . 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 Shortness of breath     with activity  . Asthma   . GERD (gastroesophageal reflux disease)   . COPD (chronic obstructive pulmonary disease)     sees Dr. Loraine Leriche Donor  . Emphysema     hx of  . Ulcerative colitis     hx of  . Neuromuscular disorder     hx of parestesia    Past Surgical History  Procedure Date  . Other surgical history     14 surgeries to r hip/femur (post fall with fx and chronic infections)  . Incision and drainage hip 08/09/2011   Procedure: IRRIGATION AND DEBRIDEMENT HIP;  Surgeon: Kathryne Hitch, MD;  Location: [facility] OR;  Service: Orthopedics;  Laterality: Right;  Irrigation and debridement right hip  . Joint replacement     r thr 2006. 14 surgeries on hip  . Incision and drainage hip 09/13/2011    Procedure: IRRIGATION AND DEBRIDEMENT HIP;  Surgeon: Kathryne Hitch, MD;  Location: [facility] OR;  Service: Orthopedics;  Laterality: Right;  Right hip irrigation and debridement  . Incision and drainage hip 11/19/2011    Procedure: IRRIGATION AND DEBRIDEMENT HIP;  Surgeon: Kathryne Hitch, MD;  Location: [facility] OR;  Service: Orthopedics;  Laterality: Right;  Irrigation and Debridement Right Hip Joint  ergies:   Allergies  Allergen Reactions  . Vancomycin Hives    Medications:  Scheduled:   . albuterol  2.5 mg Nebulization Q6H  . arformoterol  15 mcg Nebulization BID  . atorvastatin  5 mg Oral 3 times weekly  . calcium-vitamin D  1 tablet Oral Daily  . chlorhexidine  60 mL Topical Once  . cholecalciferol  400 Units Oral Daily  . diphenhydrAMINE  25 mg Oral Q24H  . dorzolamide  1 drop Both Eyes BID  . enoxaparin (LOVENOX) injection  30 mg Subcutaneous Q12H  . latanoprost  1 drop Both Eyes QHS  . mesalamine  800 mg Oral TID  . mulitivitamin  with minerals  1 tablet Oral Daily  . pantoprazole  40 mg Oral Daily  . saccharomyces boulardii  250 mg Oral Daily  . tiotropium  18 mcg Inhalation Daily  . vancomycin  1,000 mg Intravenous Q24H  . vitamin C  500 mg Oral Daily  . zolpidem  5 mg Oral QHS    Social History:  reports that she quit smoking about 10 years ago. She has never used smokeless tobacco. She reports that she does not drink alcohol or use illicit drugs.  History reviewed. No pertinent family history.  General ROS: has good apetite, lost 30# in last year, normal BM, normal urination . Per family has had blood in her stool recently. States that every time she is put on anbx she gets UC flare.    Blood pressure 138/72, pulse 91, temperature 100.1 F (37.8 C), temperature source Oral, resp. rate 18, height 5\' 1"  (1.549 m), weight 61.689 kg (136 lb), SpO2 98.00%. General appearance: cooperative, appears stated age and no distress Eyes: negative findings: pupils equal, round, reactive to light and accomodation Throat: normal findings: oropharynx pink & moist without lesions or evidence of thrush Neck: no adenopathy Lungs: clear to auscultation bilaterally Heart: regular rate and rhythm Abdomen: normal findings: bowel sounds normal and soft, non-tender Extremities: edema none and R hip VAC in place, clean. no prei-incisional erythema.    No results found for this or any previous visit (from the past 48 hour(s)).    Component Value Date/Time   SDES WOUND HIP RIGHT 11/19/2011 0924   SDES WOUND HIP RIGHT 11/19/2011 0924   SPECREQUEST PT ON DOXYCYCLINE 11/19/2011 0924   SPECREQUEST PT ON DOXYCYCLINE 11/19/2011 0924   CULT NO GROWTH 2 DAYS 11/19/2011 0924   CULT NO ANAEROBES ISOLATED; CULTURE IN PROGRESS FOR 5 DAYS 11/19/2011 0924   REPTSTATUS 11/21/2011 FINAL 11/19/2011 0924   REPTSTATUS PENDING 11/19/2011 0924   No results found.  Thank you so much for this interesting consult,   Bridget Duncan [phone] 11/21/2011, 4:35 PM     LOS: 3 days

## 2011-11-21 NOTE — Progress Notes (Signed)
Patient ID: Bridget Duncan, female   DOB: 05/03/1926, 78 y.o.   MRN: [id number] Comfortable today.  Will likely d/c to home tomorrow.  Will obtain Infectious Disease consult for antibiotic recommendations.  She has been on chronic suppression for many years now, but recently has had more hip wound drainage and I've had problems getting her to heal her incision.  The incision is closed completely with an incisional vac in place.  Will order a PICC line today as well.  The family is also requesting a GI consult due to her chronic colitis.

## 2011-11-21 NOTE — Progress Notes (Signed)
CARE MANAGEMENT NOTE 11/21/2011  Patient:  Bridget Duncan, Bridget Duncan   Account Number:  [id number]  Date Initiated:  11/21/2011  Documentation initiated by:  Vance Peper  Subjective/Objective Assessment:   79 yr old female s/p I & D of right hip, removal of retained loose cable right hip     Action/Plan:   Spoke with patient. States she has used [place] in the past, will contact her daughter as well. will follow for [facility] needs   Anticipated DC Date:     Anticipated DC Plan:  HOME W HOME HEALTH SERVICES         Choice offered to / List presented to:             Status of service:  In process, will continue to follow

## 2011-11-21 NOTE — Progress Notes (Signed)
Peripherally Inserted Central Catheter/Midline Placement  The IV Nurse has discussed with the patient and/or persons authorized to consent for the patient, the purpose of this procedure and the potential benefits and risks involved with this procedure.  The benefits include less needle sticks, lab draws from the catheter and patient may be discharged home with the catheter.  Risks include, but not limited to, infection, bleeding, blood clot (thrombus formation), and puncture of an artery; nerve damage and irregular heat beat.  Alternatives to this procedure were also discussed.  PICC/Midline Placement Documentation        Timmothy Sours 11/21/2011, 6:51 PM

## 2011-11-21 NOTE — Progress Notes (Signed)
Physical Therapy Evaluation Note  Past Medical History  Diagnosis Date  . Arthritis   . Osteoporosis   . Hypertension   . Paresthesia   . 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 Shortness of breath     with activity  . Asthma   . GERD (gastroesophageal reflux disease)   . COPD (chronic obstructive pulmonary disease)     sees Dr. Loraine Leriche Donor  . Emphysema     hx of  . Ulcerative colitis     hx of  . Neuromuscular disorder     hx of parestesia    Past Surgical History  Procedure Date  . Other surgical history     14 surgeries to r hip/femur (post fall with fx and chronic infections)  . Incision and drainage hip 08/09/2011    Procedure: IRRIGATION AND DEBRIDEMENT HIP;  Surgeon: Kathryne Hitch, MD;  Location: [facility] OR;  Service: Orthopedics;  Laterality: Right;  Irrigation and debridement right hip  . Joint replacement     r thr 2006. 14 surgeries on hip  . Incision and drainage hip 09/13/2011    Procedure: IRRIGATION AND DEBRIDEMENT HIP;  Surgeon: Kathryne Hitch, MD;  Location: [facility] OR;  Service: Orthopedics;  Laterality: Right;  Right hip irrigation and debridement     11/21/11 0914  PT Visit Information  Last PT Received On 11/21/11  Assistance Needed +2 (for O2 tank and lines/chair follow)  PT Time Calculation  PT Start Time 0914  Subjective Data  Subjective Pt received supine in bed with request to use bathroom  Precautions  Precautions Fall  Restrictions  Weight Bearing Restrictions Yes  RLE Weight Bearing WBAT  Home Living  Lives With (daughter)  Available Help at Discharge Available 24 hours/day  Type of Home House  Home Access Ramped entrance  Home Layout One level  Bathroom Shower/Tub Tub/shower unit  Bathroom Toilet Handicapped height  Bathroom Accessibility Yes  How Accessible Accessible via walker  Home Adaptive Equipment Walker - rolling;Tub transfer bench;Bedside commode/3-in-1;Raised toilet seat with rails;Wheelchair  - manual  Additional Comments 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  Prior Function  Level of Independence Needs assistance  Needs Assistance Bathing;Dressing;Toileting  Bath Moderate  Dressing Moderate  Communication  Communication HOH  Cognition  Overall Cognitive Status Appears within functional limits for tasks assessed/performed  Arousal/Alertness Awake/alert  Orientation Level Oriented X4 / Intact  Behavior During Session [facility] for tasks performed  Right Upper Extremity Assessment  RUE ROM/Strength/Tone WFL  Left Upper Extremity Assessment  LUE ROM/Strength/Tone Deficits (limited per patient due to "spurs" in joint)  Right Lower Extremity Assessment  RLE ROM/Strength/Tone Deficits;Due to pain  RLE ROM/Strength/Tone Deficits grossly 3/5 due to pain  Left Lower Extremity Assessment  LLE ROM/Strength/Tone WFL for tasks assessed  Trunk Assessment  Trunk Assessment Normal  Bed Mobility  Bed Mobility Supine to Sit  Supine to Sit 3: Mod assist;HOB elevated  Details for Bed Mobility Assistance assit for trunk elevation, minA for LE management  Transfers  Transfers Sit to Stand;Stand to Sit;Stand Pivot Transfers  Sit to Stand 4: Min assist;From bed (with bed elevated)  Stand to Sit 4: Min guard;To chair/3-in-1;To bed  Stand Pivot Transfers 4: Min assist (with RW, + 1 for O2, wound vac and line management)  Details for Transfer Assistance used RW  Ambulation/Gait  Ambulation/Gait Assistance 4: Min guard  Ambulation Distance (Feet) 60 Feet  Assistive device Rolling walker  Ambulation/Gait Assistance Details  pt with mild antalgia, good walker management, +1 for chair follow and line mangement  Gait Pattern Step-through pattern;Decreased step length - right;Decreased stance time - right;Antalgic  General Gait Details pt's L LE longer than R LE  Stairs No  PT - End of Session  Equipment Utilized During Treatment Gait belt (patient has specific shoes with R LE built up)    Activity Tolerance Patient tolerated treatment well  Patient left in chair;with call bell/phone within reach (pt with preference to sit in personal w/c)  Nurse Communication Mobility status  PT Assessment  Clinical Impression Statement Pt s/p R hip I&D presenting with R LE WBAT. Patient with good home set up, equip, and 24/7 supervision assist provided by family and hired Engineer, production. Patient safe to go home when approved by MD.  PT Recommendation/Assessment Patient needs continued PT services  PT Problem List Decreased strength;Decreased range of motion;Decreased activity tolerance;Decreased balance  Barriers to Discharge None  PT Therapy Diagnosis  Difficulty walking;Abnormality of gait;Generalized weakness;Acute pain  PT Plan  PT Frequency Min 5X/week  PT Treatment/Interventions DME instruction;Gait training;Stair training;Functional mobility training;Therapeutic activities;Therapeutic exercise  PT Recommendation  Follow Up Recommendations Home health PT;Supervision/Assistance - 24 hour  Equipment Recommended None recommended by PT (has all recommended DME)  Individuals Consulted  Consulted and Agree with Results and Recommendations Patient  Acute Rehab PT Goals  PT Goal Formulation With patient  Time For Goal Achievement 11/28/11  Potential to Achieve Goals Good  Pt will go Supine/Side to Sit with modified independence;with HOB 0 degrees  PT Goal: Supine/Side to Sit - Progress Goal set today  Pt will go Sit to Stand with modified independence;with upper extremity assist (up to RW)  PT Goal: Sit to Stand - Progress Goal set today  Pt will Transfer Bed to Chair/Chair to Bed with modified independence  PT Transfer Goal: Bed to Chair/Chair to Bed - Progress Goal set today  Pt will Ambulate 51 - 150 feet;with supervision;with rolling walker  PT Goal: Ambulate - Progress Goal set today  Written Expression  Dominant Hand Right    Pain: pt reports R hip pain but did not rate. RN provided  pain medication.  Lewis Shock, PT, DPT Pager #: [phone] Office #: [phone]

## 2011-11-21 NOTE — Progress Notes (Signed)
Utilization review completed. Birttany Dechellis, RN, BSN. 11/21/11  

## 2011-11-22 MED ORDER — RIFAMPIN 300 MG PO CAPS
300.0000 mg | ORAL_CAPSULE | Freq: Every day | ORAL | Status: DC
  Administered 2011-11-22 – 2011-11-24 (×3): 300 mg via ORAL
  Filled 2011-11-22 (×4): qty 1

## 2011-11-22 MED ORDER — ALBUTEROL SULFATE (5 MG/ML) 0.5% IN NEBU
2.5000 mg | INHALATION_SOLUTION | Freq: Three times a day (TID) | RESPIRATORY_TRACT | Status: DC
  Administered 2011-11-22 – 2011-11-24 (×7): 2.5 mg via RESPIRATORY_TRACT
  Filled 2011-11-22 (×7): qty 0.5

## 2011-11-22 NOTE — Consult Note (Signed)
[facility] Consult Note  Referring Provider: No ref. provider found Primary Care Physician:  Pearla Dubonnet, MD, MD Primary Gastroenterologist:  Dr.  Antony Contras Complaint: Hip pain HPI: Bridget Duncan is an 78 y.o. white female  admitted for hip osteomyelitis which she has had chronically and recurrent leak. She has been on doxycycline and Bactrim for long-term suppression but was admitted with increased drainage. ID is seen the patient and his start her on IV vancomycin. Cultures are pending. She gives a history of ulcerative colitis which is somewhat vague and apparently diagnosed and followed over many years and [facility] before moving here. She does not think she's had colonoscopy in about 10 years. There is a mention that she has had flareups of her colitis during most episodes of osteomyelitis requiring antibiotics. However she reportedly also had C. difficile in 2010. Currently she is having about her baseline level of symptoms which include rare blood, fairly loose stools with occasional incontinence and difficulty distinguishing gas from liquid stool. She currently does not feel like she is having a flare. She is on 5-ASA 800 mg 3 times a day. She is also on a probiotic. Past Medical History  Diagnosis Date  . Arthritis   . Osteoporosis   . Hypertension   . Paresthesia   . 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 Shortness of breath     with activity  . Asthma   . GERD (gastroesophageal reflux disease)   . COPD (chronic obstructive pulmonary disease)     sees Dr. Loraine Leriche Donor  . Emphysema     hx of  . Ulcerative colitis     hx of  . Neuromuscular disorder     hx of parestesia    Past Surgical History  Procedure Date  . Other surgical history     14 surgeries to r hip/femur (post fall with fx and chronic infections)  . Incision and drainage hip 08/09/2011    Procedure: IRRIGATION AND DEBRIDEMENT HIP;  Surgeon: Kathryne Hitch, MD;  Location: [facility] OR;  Service: Orthopedics;  Laterality: Right;  Irrigation and debridement right hip  . Joint replacement     r thr 2006. 14 surgeries on hip  . Incision and drainage hip 09/13/2011    Procedure: IRRIGATION AND DEBRIDEMENT HIP;  Surgeon: Kathryne Hitch, MD;  Location: [facility] OR;  Service: Orthopedics;  Laterality: Right;  Right hip irrigation and debridement  . Incision and drainage hip 11/19/2011    Procedure: IRRIGATION AND DEBRIDEMENT HIP;  Surgeon: Kathryne Hitch, MD;  Location: [facility] OR;  Service: Orthopedics;  Laterality: Right;  Irrigation and Debridement Right Hip Joint    Medications Prior to Admission  Medication Sig Dispense Refill  . albuterol (PROVENTIL HFA;VENTOLIN HFA) 108 (90 BASE) MCG/ACT inhaler Inhale 2 puffs into the lungs every 6 (six) hours as needed. For wheezing      . albuterol (PROVENTIL) (2.5 MG/3ML) 0.083% nebulizer solution Take 2.5 mg by nebulization every 6 (six) hours as needed. For wheezing      . arformoterol (BROVANA) 15 MCG/2ML NEBU Take 15 mcg by nebulization 2 (two) times daily.        Marland Kitchen atorvastatin (LIPITOR) 10 MG tablet Take 5 mg by mouth 3 (three) times a week. On Monday, Wednesday, and Friday.      . calcium-vitamin D (OSCAL WITH D) 500-200 MG-UNIT per tablet Take 1 tablet by mouth daily.        Marland Kitchen  cholecalciferol (VITAMIN D) 400 UNITS TABS Take 400 Units by mouth daily.      . Cranberry Extract 250 MG TABS Take 1 tablet by mouth daily as needed. For bladder      . diclofenac (FLECTOR) 1.3 % PTCH Place 1 patch onto the skin at bedtime.       . diclofenac sodium (VOLTAREN) 1 % GEL Apply 1 application topically at bedtime as needed. As needed for muscle pain. Shoulder.      . diphenhydramine-acetaminophen (TYLENOL PM) 25-500 MG TABS Take 1 tablet by mouth at bedtime as needed. As needed for sleep. Patient takes with Ambien.      . dorzolamide (TRUSOPT) 2 % ophthalmic solution Place 1 drop into both eyes 2 (two) times  daily.      Marland Kitchen doxycycline (VIBRA-TABS) 100 MG tablet Take 100 mg by mouth 2 (two) times daily.      . fish oil-omega-3 fatty acids 1000 MG capsule Take 1 g by mouth daily.      Marland Kitchen HYDROcodone-acetaminophen (NORCO) 5-325 MG per tablet Take 1 tablet by mouth 4 (four) times daily. For pain      . latanoprost (XALATAN) 0.005 % ophthalmic solution Place 1 drop into both eyes at bedtime.       . lidocaine (LIDODERM) 5 % Place 1 patch onto the skin daily. Remove & Discard patch within 12 hours or as directed by MD       . meloxicam (MOBIC) 7.5 MG tablet Take 7.5 mg by mouth 2 (two) times daily.      . mesalamine (ASACOL) 400 MG EC tablet Take 800 mg by mouth 3 (three) times daily.       . methocarbamol (ROBAXIN) 500 MG tablet Take 500 mg by mouth 4 (four) times daily as needed. For spasms      . Multiple Vitamin (MULITIVITAMIN WITH MINERALS) TABS Take 1 tablet by mouth daily.      . Multiple Vitamins-Minerals (ICAPS PO) Take 1 capsule by mouth daily.      . ondansetron (ZOFRAN) 4 MG tablet Take 4 mg by mouth every 8 (eight) hours as needed. As needed for nausea.      . pantoprazole (PROTONIX) 40 MG tablet Take 40 mg by mouth daily.      . Probiotic Product (ALIGN) 4 MG CAPS Take 1 capsule by mouth daily.      Marland Kitchen tiotropium (SPIRIVA) 18 MCG inhalation capsule Place 18 mcg into inhaler and inhale daily.      . vitamin C (ASCORBIC ACID) 500 MG tablet Take 500 mg by mouth daily.      Marland Kitchen zolpidem (AMBIEN) 5 MG tablet Take 5 mg by mouth at bedtime.        Allergies:  Allergies  Allergen Reactions  . Vancomycin Hives    History reviewed. No pertinent family history.  Social History:  reports that she quit smoking about 10 years ago. She has never used smokeless tobacco. She reports that she does not drink alcohol or use illicit drugs.  Review of Systems: negative except as above   Blood pressure 152/68, pulse 94, temperature 99 F (37.2 C), temperature source Oral, resp. rate 20, height 5\' 1"  (1.549  m), weight 61.689 kg (136 lb), SpO2 90.00%. Head: Normocephalic, without obvious abnormality, atraumatic Neck: no adenopathy, no carotid bruit, no JVD, supple, symmetrical, trachea midline and thyroid not enlarged, symmetric, no tenderness/mass/nodules Resp: clear to auscultation bilaterally Cardio: regular rate and rhythm, S1, S2 normal, no murmur, click, rub  or gallop JX:BJYN nondistended with normoactive bowel sounds no hepatomegaly masses or guarding  Extremities: extremities normal, atraumatic, no cyanosis or edema  No results found for this or any previous visit (from the past 48 hour(s)). No results found.  Assessment: Reported history of ulcerative colitis not well documented by existing or available records with possibility of history of C. difficile colitis. Recurrent osteomyelitis currently being treated with vancomycin Plan:  No strong rationale to restrict antibiotics from a GI standpoint from information available, even if diagnosis of ulcerative colitis is secure. Agree with concomitant use of a probiotic and with selection of vancomycin, least possible concern about C. difficile. We'll follow with you and make suggestions as necessary. Sita Mangen C 11/22/2011, 11:44 AM

## 2011-11-22 NOTE — Progress Notes (Signed)
INFECTIOUS DISEASE PROGRESS NOTE  ID: Bridget Duncan is a 78 y.o. female with   Principal Problem:  *Infected prosthetic hip  Subjective: Without complaint. 1 BM so far today.   Abtx:  Anti-infectives     Start     Dose/Rate Route Frequency Ordered Stop   11/19/11 1300   vancomycin (VANCOCIN) IVPB 1000 mg/200 mL premix        1,000 mg 200 mL/hr over 60 Minutes Intravenous Every 24 hours 11/19/11 1134            Medications:  Scheduled:   . albuterol  2.5 mg Nebulization TID  . arformoterol  15 mcg Nebulization BID  . atorvastatin  5 mg Oral 3 times weekly  . calcium-vitamin D  1 tablet Oral Daily  . chlorhexidine  60 mL Topical Once  . cholecalciferol  400 Units Oral Daily  . diphenhydrAMINE  25 mg Oral Q24H  . dorzolamide  1 drop Both Eyes BID  . enoxaparin (LOVENOX) injection  30 mg Subcutaneous Q12H  . latanoprost  1 drop Both Eyes QHS  . mesalamine  800 mg Oral TID  . mulitivitamin with minerals  1 tablet Oral Daily  . pantoprazole  40 mg Oral Daily  . saccharomyces boulardii  250 mg Oral Daily  . tiotropium  18 mcg Inhalation Daily  . vancomycin  1,000 mg Intravenous Q24H  . vitamin C  500 mg Oral Daily  . zolpidem  5 mg Oral QHS  . DISCONTD: albuterol  2.5 mg Nebulization Q6H    Objective: Vital signs in last 24 hours: Temp:  [99 F (37.2 C)-100.4 F (38 C)] 100.4 F (38 C) (06/04 1333) Pulse Rate:  [94-113] 113  (06/04 1333) Resp:  [18-20] 18  (06/04 1333) BP: (150-154)/(64-68) 154/64 mmHg (06/04 1333) SpO2:  [79 %-98 %] 98 % (06/04 1333)   General appearance: alert, cooperative and no distress Extremities: RUE PIC clean.  Incision/Wound: clean, vac in place. No erythema.   Lab Results No results found for this basename: WBC:2,HGB:2,HCT:2,PLATELETS:2,NA:2,K:2,CL:2,CO2:2,BUN:2,CREATININE:2,GLU:2 in the last 72 hours Liver Panel No results found for this basename: PROT:2,ALBUMIN:2,AST:2,ALT:2,ALKPHOS:2,BILITOT:2,BILIDIR:2,IBILI:2 in the last 72  hours Sedimentation Rate No results found for this basename: ESRSEDRATE in the last 72 hours C-Reactive Protein No results found for this basename: CRP:2 in the last 72 hours  Microbiology: Recent Results (from the past 240 hour(s))  SURGICAL PCR SCREEN     Status: Normal   Collection Time   11/18/11  7:13 PM      Component Value Range Status Comment   MRSA, PCR NEGATIVE  NEGATIVE  Final    Staphylococcus aureus NEGATIVE  NEGATIVE  Final   WOUND CULTURE     Status: Normal   Collection Time   11/19/11  9:24 AM      Component Value Range Status Comment   Specimen Description WOUND HIP RIGHT   Final    Special Requests PT ON DOXYCYCLINE   Final    Gram Stain     Final    Value: RARE WBC PRESENT, PREDOMINANTLY PMN     RARE SQUAMOUS EPITHELIAL CELLS PRESENT     NO ORGANISMS SEEN   Culture NO GROWTH 2 DAYS   Final    Report Status 11/21/2011 FINAL   Final   ANAEROBIC CULTURE     Status: Normal (Preliminary result)   Collection Time   11/19/11  9:24 AM      Component Value Range Status Comment   Specimen Description WOUND HIP  RIGHT   Final    Special Requests PT ON DOXYCYCLINE   Final    Gram Stain     Final    Value: RARE WBC PRESENT, PREDOMINANTLY PMN     RARE SQUAMOUS EPITHELIAL CELLS PRESENT     NO ORGANISMS SEEN   Culture     Final    Value: NO ANAEROBES ISOLATED; CULTURE IN PROGRESS FOR 5 DAYS   Report Status PENDING   Incomplete     Studies/Results: No results found.   Assessment/Plan: Chronic Osteomyelitis R hip Appreciate GI eval. Will start  rifampin. Continue vanco. Check LFTs Cx from this admit is (-). Previous MRSA 2010.  Would plan on 6 weeks of vanco and rifampin and then change to bactrim and rifampin or doxy and rifampin.  Will see her in clinic in ~ 5 weeks.   Johny Sax Infectious Diseases [phone] 11/22/2011, 2:37 PM   LOS: 4 days

## 2011-11-22 NOTE — Progress Notes (Signed)
Physical Therapy Treatment Patient Details Name: DELISA FINCK MRN: [id number] DOB: 1926-03-26 Today's Date: 11/22/2011 Time: 8657-8469 PT Time Calculation (min): 31 min  PT Assessment / Plan / Recommendation Comments on Treatment Session  Pt continues to move fairly well.  Mobilizes at a level well enough to safely d/c home with family when MD feels appropriate.     Follow Up Recommendations  Home health PT;Supervision/Assistance - 24 hour    Barriers to Discharge        Equipment Recommendations  None recommended by PT    Recommendations for Other Services    Frequency Min 5X/week   Plan Discharge plan remains appropriate    Precautions / Restrictions Precautions Precautions: Fall Restrictions Weight Bearing Restrictions: Yes RLE Weight Bearing: Weight bearing as tolerated       Mobility  Bed Mobility Bed Mobility: Supine to Sit Supine to Sit: HOB flat;3: Mod assist Details for Bed Mobility Assistance: (A) to lift shoulders/trunk to sitting upright; use of draw pad to piovt hips around to EOB & scoot closer to EOB.  Transfers Transfers: Sit to Stand;Stand to Sit Sit to Stand: 4: Min assist;With upper extremity assist;From bed;From chair/3-in-1 Stand to Sit: 5: Supervision;With upper extremity assist;With armrests;To chair/3-in-1 Details for Transfer Assistance: (A) to achieve standing & balance with sit>stand; Pt extends RLE with sit<>stand due to she reports she has difficulty with knee flexion.   Ambulation/Gait Ambulation/Gait Assistance: 4: Min guard Ambulation Distance (Feet): 65 Feet Assistive device: Rolling walker Ambulation/Gait Assistance Details: Guarding for safety; cont's to ambulate with mild antalgic gait but overall smooth pattern.   Gait Pattern: Step-through pattern;Decreased stride length;Antalgic General Gait Details: pt's L LE longer than R LE Stairs: No    Exercises General Exercises - Lower Extremity Ankle Circles/Pumps: AROM;20  reps;Both Quad Sets: AROM;Both;10 reps Gluteal Sets: AROM;Both;10 reps Hip ABduction/ADduction: AAROM;Right;10 reps;Supine Straight Leg Raises: AAROM;Right;10 reps;Supine   PT Diagnosis:    PT Problem List:   PT Treatment Interventions:     PT Goals Acute Rehab PT Goals Time For Goal Achievement: 11/28/11 Potential to Achieve Goals: Good PT Goal: Supine/Side to Sit - Progress: Not met PT Goal: Sit to Stand - Progress: Not met PT Goal: Ambulate - Progress: Progressing toward goal  Visit Information  Last PT Received On: 11/22/11 Assistance Needed: +2 (+2 for equipment + lines)    Subjective Data      Cognition  Overall Cognitive Status: Appears within functional limits for tasks assessed/performed Arousal/Alertness: Awake/alert Orientation Level: Oriented X4 / Intact Behavior During Session: [facility] for tasks performed    Balance     End of Session PT - End of Session Equipment Utilized During Treatment: Gait belt;Oxygen;Other (comment) (specific shoes with RLE built up) Activity Tolerance: Patient tolerated treatment well Patient left: in chair;with call bell/phone within reach    Lara Mulch 11/22/2011, 11:06 AM   Verdell Face, PTA [phone] 11/22/2011

## 2011-11-22 NOTE — Progress Notes (Signed)
ANTIBIOTIC CONSULT NOTE - FOLLOW UP  Pharmacy Consult for Vancomycin Indication:prosthetic right hip joint chronic infection  Allergies  Allergen Reactions  . Vancomycin Hives    Patient Measurements: Height: 5\' 1"  (154.9 cm) Weight: 136 lb (61.689 kg) IBW/kg (Calculated) : 47.8    Vital Signs: Temp: 99 F (37.2 C) (06/04 0617) BP: 152/68 mmHg (06/04 0617) Pulse Rate: 94  (06/04 0617) Intake/Output from previous day: 06/03 0701 - 06/04 0700 In: 600 [P.O.:600] Out: 1 [Urine:1] Intake/Output from this shift: Total I/O In: 360 [P.O.:360] Out: -   Labs: No results found for this basename: WBC:3,HGB:3,PLT:3,LABCREA:3,CREATININE:3, in the last 72 hours Estimated Creatinine Clearance: 40.1 ml/min (by C-G formula based on Cr of 0.88). No results found for this basename: VANCOTROUGH:2,VANCOPEAK:2,VANCORANDOM:2,GENTTROUGH:2,GENTPEAK:2,GENTRANDOM:2,TOBRATROUGH:2,TOBRAPEAK:2,TOBRARND:2,AMIKACINPEAK:2,AMIKACINTROU:2,AMIKACIN:2, in the last 72 hours   Microbiology: Recent Results (from the past 720 hour(s))  SURGICAL PCR SCREEN     Status: Normal   Collection Time   11/18/11  7:13 PM      Component Value Range Status Comment   MRSA, PCR NEGATIVE  NEGATIVE  Final    Staphylococcus aureus NEGATIVE  NEGATIVE  Final   WOUND CULTURE     Status: Normal   Collection Time   11/19/11  9:24 AM      Component Value Range Status Comment   Specimen Description WOUND HIP RIGHT   Final    Special Requests PT ON DOXYCYCLINE   Final    Gram Stain     Final    Value: RARE WBC PRESENT, PREDOMINANTLY PMN     RARE SQUAMOUS EPITHELIAL CELLS PRESENT     NO ORGANISMS SEEN   Culture NO GROWTH 2 DAYS   Final    Report Status 11/21/2011 FINAL   Final   ANAEROBIC CULTURE     Status: Normal (Preliminary result)   Collection Time   11/19/11  9:24 AM      Component Value Range Status Comment   Specimen Description WOUND HIP RIGHT   Final    Special Requests PT ON DOXYCYCLINE   Final    Gram Stain      Final    Value: RARE WBC PRESENT, PREDOMINANTLY PMN     RARE SQUAMOUS EPITHELIAL CELLS PRESENT     NO ORGANISMS SEEN   Culture     Final    Value: NO ANAEROBES ISOLATED; CULTURE IN PROGRESS FOR 5 DAYS   Report Status PENDING   Incomplete     Anti-infectives     Start     Dose/Rate Route Frequency Ordered Stop   11/19/11 1300   vancomycin (VANCOCIN) IVPB 1000 mg/200 mL premix        1,000 mg 200 mL/hr over 60 Minutes Intravenous Every 24 hours 11/19/11 1134            Assessment: 86 YOF with 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 Patient admitted with right hip pain with drainage, s/p I&D today 11/19/11. Pharmacy consulted to manage vancomycin therapy on this patient with mild CKD.Benadryl will be given prior to vanc administration.   Goal of Therapy:  Vancomycin trough level 15-20 mcg/ml  Plan:  vanc d4  Cont vanc 1 gm q24h Trough on 11/23/11 at 1200  Lucille Passy 11/22/2011,11:44 AM

## 2011-11-22 NOTE — Plan of Care (Signed)
Problem: Diagnosis - Type of Surgery Goal: General Surgical Patient Education (See Patient Education module for education specifics)  Outcome: Completed/Met Date Met:  11/22/11 R hip I&D

## 2011-11-22 NOTE — Progress Notes (Addendum)
Referral received for SNF. Chart reviewed and CSW has spoken with RNCM who indicates that patient is for DC to home with Home Health and DME.  Met with patient and her son Bridget Duncan. She lives with her daughter and states that she will have all the assistance that she needs. CSW to sign off. Please re-consult if CSW needs arise.  Lorri Frederick. [place]  [phone]

## 2011-11-22 NOTE — Progress Notes (Signed)
Patient ID: Bridget Duncan, female   DOB: 30-Jan-1926, 78 y.o.   MRN: [id number] No acute changes.  Feels well.  Appreciate ID consult and their input regarding antibiotic.  Will nee PICC line and IV antibiotics for home upon discharge.  Given her history of colitis with antibiotics, will obtain a GI consult prior to discharge for recommendations/treatment.  No rush to get her out of the hospital given her situation

## 2011-11-23 LAB — COMPREHENSIVE METABOLIC PANEL
ALT: 20 U/L (ref 0–35)
AST: 26 U/L (ref 0–37)
Albumin: 2.3 g/dL — ABNORMAL LOW (ref 3.5–5.2)
CO2: 28 mEq/L (ref 19–32)
Calcium: 9.1 mg/dL (ref 8.4–10.5)
GFR calc non Af Amer: 79 mL/min — ABNORMAL LOW (ref >90)
Sodium: 138 mEq/L (ref 135–145)

## 2011-11-23 MED ORDER — LOPERAMIDE HCL 2 MG PO CAPS
2.0000 mg | ORAL_CAPSULE | ORAL | Status: DC | PRN
  Filled 2011-11-23: qty 1

## 2011-11-23 MED ORDER — VANCOMYCIN HCL IN DEXTROSE 1-5 GM/200ML-% IV SOLN: 1000.0000 mg | INTRAVENOUS | Status: DC

## 2011-11-23 MED ORDER — RIFAMPIN 300 MG PO CAPS: 300.0000 mg | ORAL_CAPSULE | Freq: Every day | ORAL | Status: AC

## 2011-11-23 MED ORDER — SODIUM CHLORIDE 0.9 % IV SOLN
500.0000 mg | Freq: Two times a day (BID) | INTRAVENOUS | Status: DC
  Filled 2011-11-23 (×2): qty 500

## 2011-11-23 NOTE — Progress Notes (Signed)
Patient ID: Bridget Duncan, female   DOB: 09/18/1925, 78 y.o.   MRN: [id number] Doing well.  Appreciate GI and ID consults and input.  I removed the incisional vac this am and her suture line is intact and dry.  Plan to d/c to home today on IV antibiotics thru the picc line.

## 2011-11-23 NOTE — Progress Notes (Signed)
INFECTIOUS DISEASE PROGRESS NOTE  ID: Bridget Duncan is a 78 y.o. female with   Principal Problem:  *Infected prosthetic hip  Subjective: Without complaint  Abtx:  Anti-infectives     Start     Dose/Rate Route Frequency Ordered Stop   11/24/11 0000   vancomycin (VANCOCIN) 500 mg in sodium chloride 0.9 % 100 mL IVPB        500 mg 100 mL/hr over 60 Minutes Intravenous Every 12 hours 11/23/11 1423     11/23/11 0000   vancomycin (VANCOCIN) 1 GM/200ML SOLN        1,000 mg 200 mL/hr over 60 Minutes Intravenous Every 24 hours 11/23/11 0707     11/23/11 0000   rifampin (RIFADIN) 300 MG capsule        300 mg Oral Daily 11/23/11 0707 12/03/11 2359   11/22/11 1600   rifampin (RIFADIN) capsule 300 mg        300 mg Oral Daily 11/22/11 1447 01/01/12 0959   11/19/11 1300   vancomycin (VANCOCIN) IVPB 1000 mg/200 mL premix  Status:  Discontinued        1,000 mg 200 mL/hr over 60 Minutes Intravenous Every 24 hours 11/19/11 1134 11/23/11 1422          Medications:  Scheduled:   . albuterol  2.5 mg Nebulization TID  . arformoterol  15 mcg Nebulization BID  . atorvastatin  5 mg Oral 3 times weekly  . calcium-vitamin D  1 tablet Oral Daily  . chlorhexidine  60 mL Topical Once  . cholecalciferol  400 Units Oral Daily  . diphenhydrAMINE  25 mg Oral Q24H  . dorzolamide  1 drop Both Eyes BID  . enoxaparin (LOVENOX) injection  30 mg Subcutaneous Q12H  . latanoprost  1 drop Both Eyes QHS  . mesalamine  800 mg Oral TID  . mulitivitamin with minerals  1 tablet Oral Daily  . pantoprazole  40 mg Oral Daily  . rifampin  300 mg Oral Daily  . saccharomyces boulardii  250 mg Oral Daily  . tiotropium  18 mcg Inhalation Daily  . vancomycin  500 mg Intravenous Q12H  . vitamin C  500 mg Oral Daily  . zolpidem  5 mg Oral QHS  . DISCONTD: vancomycin  1,000 mg Intravenous Q24H    Objective: Vital signs in last 24 hours: Temp:  [98.6 F (37 C)-99.2 F (37.3 C)] 98.6 F (37 C) (06/05  1420) Pulse Rate:  [87-90] 89  (06/05 1432) Resp:  [16-20] 16  (06/05 1432) BP: (110-136)/(50-68) 110/68 mmHg (06/05 1420) SpO2:  [96 %-99 %] 96 % (06/05 1432)   Incision/Wound: dressed. VAC removed.   Lab Results  Basename 11/23/11 0540  WBC --  HGB --  HCT --  NA 138  K 3.7  CL 104  CO2 28  BUN 19  CREATININE 0.66  GLU --   Liver Panel  Basename 11/23/11 0540  PROT 5.8*  ALBUMIN 2.3*  AST 26  ALT 20  ALKPHOS 49  BILITOT 0.7  BILIDIR --  IBILI --   Sedimentation Rate No results found for this basename: ESRSEDRATE in the last 72 hours C-Reactive Protein No results found for this basename: CRP:2 in the last 72 hours  Microbiology: Recent Results (from the past 240 hour(s))  SURGICAL PCR SCREEN     Status: Normal   Collection Time   11/18/11  7:13 PM      Component Value Range Status Comment   MRSA, PCR NEGATIVE  NEGATIVE  Final    Staphylococcus aureus NEGATIVE  NEGATIVE  Final   WOUND CULTURE     Status: Normal   Collection Time   11/19/11  9:24 AM      Component Value Range Status Comment   Specimen Description WOUND HIP RIGHT   Final    Special Requests PT ON DOXYCYCLINE   Final    Gram Stain     Final    Value: RARE WBC PRESENT, PREDOMINANTLY PMN     RARE SQUAMOUS EPITHELIAL CELLS PRESENT     NO ORGANISMS SEEN   Culture NO GROWTH 2 DAYS   Final    Report Status 11/21/2011 FINAL   Final   ANAEROBIC CULTURE     Status: Normal (Preliminary result)   Collection Time   11/19/11  9:24 AM      Component Value Range Status Comment   Specimen Description WOUND HIP RIGHT   Final    Special Requests PT ON DOXYCYCLINE   Final    Gram Stain     Final    Value: RARE WBC PRESENT, PREDOMINANTLY PMN     RARE SQUAMOUS EPITHELIAL CELLS PRESENT     NO ORGANISMS SEEN   Culture     Final    Value: NO ANAEROBES ISOLATED; CULTURE IN PROGRESS FOR 5 DAYS   Report Status PENDING   Incomplete     Studies/Results: No results found.   Assessment/Plan: Chronic  Osteomyelitis of R hip Day 5 vanco, rifampin Plan for 6 weeks of vanco/rif.  See her back in ID clinic in f/u.  Op Cx negative  Johny Sax Infectious Diseases [phone] 11/23/2011, 5:38 PM   LOS: 5 days

## 2011-11-23 NOTE — Progress Notes (Signed)
Pt had loose, incontinent (unable to get to [facility] or [facility] in time) stool this am with noted blood.  Pt and daughter express concerns about being able to care for self/mother at home.  Pt is alone for approximately 5 hours during the day.  With IV antibiotics and history of colitis, pt is having uncontrollable bowel movements at times.  Pt and daughter ask that Dr. Magnus Ivan and Dr. Madilyn Fireman be notified.  Dr. Magnus Ivan notified of patient request and order for SNF placement received.  Dr. Madilyn Fireman notified with no new orders, but plans to see pt in am.

## 2011-11-23 NOTE — Progress Notes (Signed)
ANTIBIOTIC CONSULT NOTE - FOLLOW UP  Pharmacy Consult for Vancomycin Indication: prosthetic right hip joint chronic infection  Allergies  Allergen Reactions  . Vancomycin Hives    Patient Measurements: Height: 5\' 1"  (154.9 cm) Weight: 136 lb (61.689 kg) IBW/kg (Calculated) : 47.8   Vital Signs: Temp: 98.8 F (37.1 C) (06/05 0600) Temp src: Oral (06/05 0600) BP: 136/50 mmHg (06/05 0600) Pulse Rate: 87  (06/05 0600) Intake/Output from previous day: 06/04 0701 - 06/05 0700 In: 1560 [P.O.:1560] Out: 0  Intake/Output from this shift:    Labs:  Basename 11/23/11 0540  WBC --  HGB --  PLT --  LABCREA --  CREATININE 0.66   Estimated Creatinine Clearance: 44.1 ml/min (by C-G formula based on Cr of 0.66).  Basename 11/23/11 1150  VANCOTROUGH 8.9*  VANCOPEAK --  Drue Dun --  GENTTROUGH --  GENTPEAK --  GENTRANDOM --  TOBRATROUGH --  TOBRAPEAK --  TOBRARND --  AMIKACINPEAK --  AMIKACINTROU --  AMIKACIN --     Microbiology: Recent Results (from the past 720 hour(s))  SURGICAL PCR SCREEN     Status: Normal   Collection Time   11/18/11  7:13 PM      Component Value Range Status Comment   MRSA, PCR NEGATIVE  NEGATIVE  Final    Staphylococcus aureus NEGATIVE  NEGATIVE  Final   WOUND CULTURE     Status: Normal   Collection Time   11/19/11  9:24 AM      Component Value Range Status Comment   Specimen Description WOUND HIP RIGHT   Final    Special Requests PT ON DOXYCYCLINE   Final    Gram Stain     Final    Value: RARE WBC PRESENT, PREDOMINANTLY PMN     RARE SQUAMOUS EPITHELIAL CELLS PRESENT     NO ORGANISMS SEEN   Culture NO GROWTH 2 DAYS   Final    Report Status 11/21/2011 FINAL   Final   ANAEROBIC CULTURE     Status: Normal (Preliminary result)   Collection Time   11/19/11  9:24 AM      Component Value Range Status Comment   Specimen Description WOUND HIP RIGHT   Final    Special Requests PT ON DOXYCYCLINE   Final    Gram Stain     Final    Value: RARE  WBC PRESENT, PREDOMINANTLY PMN     RARE SQUAMOUS EPITHELIAL CELLS PRESENT     NO ORGANISMS SEEN   Culture     Final    Value: NO ANAEROBES ISOLATED; CULTURE IN PROGRESS FOR 5 DAYS   Report Status PENDING   Incomplete     Anti-infectives     Start     Dose/Rate Route Frequency Ordered Stop   11/23/11 0000   vancomycin (VANCOCIN) 1 GM/200ML SOLN        1,000 mg 200 mL/hr over 60 Minutes Intravenous Every 24 hours 11/23/11 0707     11/23/11 0000   rifampin (RIFADIN) 300 MG capsule        300 mg Oral Daily 11/23/11 0707 12/03/11 2359   11/22/11 1600   rifampin (RIFADIN) capsule 300 mg        300 mg Oral Daily 11/22/11 1447 01/01/12 0959   11/19/11 1300   vancomycin (VANCOCIN) IVPB 1000 mg/200 mL premix        1,000 mg 200 mL/hr over 60 Minutes Intravenous Every 24 hours 11/19/11 1134  Assessment: 19 YOF with 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 Patient admitted with right hip pain with drainage, s/p I&D today 11/19/11. Pharmacy consulted to manage vancomycin therapy on this patient with mild CKD.Benadryl will be given prior to vanc administration. Vancomycin trough = 8.9 on 1 gm q24h      Goal of Therapy:  Vancomycin trough level 15-20 mcg/ml  Plan:  Change to vancomycin 500 mg iv q12h Level at steady state  Lucille Passy 11/23/2011,2:13 PM

## 2011-11-23 NOTE — Clinical Social Work Placement (Addendum)
    Clinical Social Work Department CLINICAL SOCIAL WORK PLACEMENT NOTE 11/23/2011  Patient:  Bridget Duncan, Bridget Duncan  Account Number:  [id number] Admit date:  11/18/2011  Clinical Social Worker:  Unk Lightning, LCSW  Date/time:  11/23/2011 01:45 PM  Clinical Social Work is seeking post-discharge placement for this patient at the following level of care:   SKILLED NURSING   (*CSW will update this form in Epic as items are completed)   11/23/2011  Patient/family provided with [facility] Department of Clinical Social Work's list of facilities offering this level of care within the geographic area requested by the patient (or if unable, by the patient's family).  11/23/2011  Patient/family informed of their freedom to choose among providers that offer the needed level of care, that participate in Medicare, Medicaid or managed care program needed by the patient, have an available bed and are willing to accept the patient.  11/23/2011  Patient/family informed of [facility]' ownership interest in [facility], as well as of the fact that they are under no obligation to receive care at this facility.  PASARR submitted to [employer] on 12/05/2008 PASARR number received from [employer] on 12/05/2008  FL2 transmitted to all facilities in geographic area requested by pt/family on  11/23/2011 FL2 transmitted to all facilities within larger geographic area on  NA  Patient informed that his/her managed care company has contracts with or will negotiate with  certain facilities, including the following:  NA   Patient/family informed of bed offers received:  11/23/2011  (DTC) Patient chooses bed at  [facility] (DTC) Physician recommends and patient chooses bed at   NA  Patient to be transferred to [facility] on  11/24/2011  (DTC) Patient to be transferred to facility by [facility]  (DTC)  The following physician request were entered in Epic:   Additional Comments: Patient and  daughter are pleased with d/c plan. Notified SNF and pt's nurse of d/c.  PICC line flushed and clamped. Patient is stable for d/c per MD.  Lupita Leash T. Jaci Lazier, MSW  [phone]

## 2011-11-23 NOTE — Progress Notes (Signed)
Patient ID: Bridget Duncan, female   DOB: 08-19-1925, 78 y.o.   MRN: [id number] Due to her difficulties with colitis and diarrhea from her IV antibiotics she and her family would like her to go to a skilled nursing facility.  May do this tomorrow.

## 2011-11-23 NOTE — Progress Notes (Signed)
Physical Therapy Treatment Patient Details Name: Bridget Duncan MRN: [id number] DOB: 07/22/1925 Today's Date: 11/23/2011 Time: 8295-6213 PT Time Calculation (min): 20 min  PT Assessment / Plan / Recommendation Comments on Treatment Session  Pt voices concern about returning home.  States she will be home for ~2 hrs in AM & ~3 hrs in PM by herself.  Pt was able to Increase ambulation distance today.  Ambulated without supplemental 02 today- DOE noted.  Supplemental 02 reapplied immediately when returning to room    Follow Up Recommendations  Home health PT;Supervision/Assistance - 24 hour    Barriers to Discharge        Equipment Recommendations  None recommended by PT    Recommendations for Other Services    Frequency Min 5X/week   Plan Discharge plan remains appropriate    Precautions / Restrictions Precautions Precautions: Fall Restrictions RLE Weight Bearing: Weight bearing as tolerated       Mobility  Bed Mobility Bed Mobility: Sit to Supine Sit to Supine: 3: Mod assist;HOB flat Details for Bed Mobility Assistance: (A) to lift LE's onto bed & to lower shoulders/trunk to supine position.  Cues for technique.   Transfers Transfers: Sit to Stand;Stand to Sit Sit to Stand: 4: Min assist;With upper extremity assist;With armrests;From chair/3-in-1 Stand to Sit: 5: Supervision;With upper extremity assist;With armrests;To bed;To chair/3-in-1 Details for Transfer Assistance: (A) for balance & safety with standing Ambulation/Gait Ambulation/Gait Assistance: 5: Supervision Ambulation Distance (Feet): 100 Feet Assistive device: Rolling walker Ambulation/Gait Assistance Details: Supervision for safety.  Cues for pursed lip breathing.  Supplemental 02 reapplied immediately after ambulating.   Gait Pattern: Step-through pattern;Decreased step length - left;Decreased step length - right;Decreased stride length General Gait Details: pt's L LE longer than R LE Stairs: No      Exercises     PT Diagnosis:    PT Problem List:   PT Treatment Interventions:     PT Goals Acute Rehab PT Goals Time For Goal Achievement: 11/28/11 Potential to Achieve Goals: Good PT Goal: Sit to Stand - Progress: Progressing toward goal PT Goal: Ambulate - Progress: Progressing toward goal  Visit Information  Last PT Received On: 11/23/11 Assistance Needed: +1    Subjective Data      Cognition  Overall Cognitive Status: Appears within functional limits for tasks assessed/performed Arousal/Alertness: Awake/alert Orientation Level: Oriented X4 / Intact Behavior During Session: [facility] for tasks performed    Balance     End of Session PT - End of Session Equipment Utilized During Treatment: Gait belt;Other (comment) (pt has specific shoes with RLE built up) Activity Tolerance: Patient tolerated treatment well;Patient limited by fatigue Patient left: in bed;with call bell/phone within reach    Lara Mulch 11/23/2011, 3:22 PM  Verdell Face, PTA [phone] 11/23/2011

## 2011-11-23 NOTE — Discharge Summary (Signed)
Patient ID: Bridget Duncan MRN: [id number] DOB/AGE: 76-03-1926 76 y.o.  Admit date: 11/18/2011 Discharge date: 11/23/2011  Admission Diagnoses:  Principal Problem:  *Infected prosthetic hip   Discharge Diagnoses:  Same  Past Medical History  Diagnosis Date  . Arthritis   . Osteoporosis   . Hypertension   . Paresthesia   . 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 Shortness of breath     with activity  . Asthma   . GERD (gastroesophageal reflux disease)   . COPD (chronic obstructive pulmonary disease)     sees Dr. Loraine Leriche Donor  . Emphysema     hx of  . Ulcerative colitis     hx of  . Neuromuscular disorder     hx of parestesia    Surgeries: Procedure(s): IRRIGATION AND DEBRIDEMENT HIP on 11/18/2011 - 11/19/2011   Consultants: Treatment Team:  Shirley Friar, MD Barrie Folk, MD  Discharged Condition: Improved  Hospital Course: Bridget Duncan is an 78 y.o. female who was admitted 11/18/2011 for operative treatment ofInfected prosthetic hip. Patient has severe unremitting pain that affects sleep, daily activities, and work/hobbies. After pre-op clearance the patient was taken to the operating room on 11/18/2011 - 11/19/2011 and underwent  Procedure(s): IRRIGATION AND DEBRIDEMENT HIP.    Patient was given perioperative antibiotics: Anti-infectives     Start     Dose/Rate Route Frequency Ordered Stop   11/23/11 0000   vancomycin (VANCOCIN) 1 GM/200ML SOLN        1,000 mg 200 mL/hr over 60 Minutes Intravenous Every 24 hours 11/23/11 0707     11/23/11 0000   rifampin (RIFADIN) 300 MG capsule        300 mg Oral Daily 11/23/11 0707 12/03/11 2359   11/22/11 1600   rifampin (RIFADIN) capsule 300 mg        300 mg Oral Daily 11/22/11 1447 01/01/12 0959   11/19/11 1300   vancomycin (VANCOCIN) IVPB 1000 mg/200 mL premix        1,000 mg 200 mL/hr over 60 Minutes Intravenous Every 24 hours 11/19/11 1134             Patient was given sequential  compression devices, early ambulation, and chemoprophylaxis to prevent DVT.  Patient benefited maximally from hospital stay and there were no complications.    Recent vital signs: Patient Vitals for the past 24 hrs:  BP Temp Temp src Pulse Resp SpO2  11/22/11 2200 132/64 mmHg 99.2 F (37.3 C) Oral 87  20  99 %  11/22/11 2122 - - - - - 99 %  11/22/11 1333 154/64 mmHg 100.4 F (38 C) - 113  18  98 %  11/22/11 0900 - - - - - 90 %     Recent laboratory studies:  Basename 11/23/11 0540  WBC --  HGB --  HCT --  PLT --  NA 138  K 3.7  CL 104  CO2 28  BUN 19  CREATININE 0.66  GLUCOSE 104*  INR --  CALCIUM 9.1     Discharge Medications:   Medication List  As of 11/23/2011  7:08 AM   TAKE these medications         albuterol 108 (90 BASE) MCG/ACT inhaler   Commonly known as: PROVENTIL HFA;VENTOLIN HFA   Inhale 2 puffs into the lungs every 6 (six) hours as needed. For wheezing      albuterol (2.5 MG/3ML) 0.083% nebulizer solution   Commonly known  as: PROVENTIL   Take 2.5 mg by nebulization every 6 (six) hours as needed. For wheezing      ALIGN 4 MG Caps   Take 1 capsule by mouth daily.      arformoterol 15 MCG/2ML Nebu   Commonly known as: BROVANA   Take 15 mcg by nebulization 2 (two) times daily.      atorvastatin 10 MG tablet   Commonly known as: LIPITOR   Take 5 mg by mouth 3 (three) times a week. On Monday, Wednesday, and Friday.      calcium-vitamin D 500-200 MG-UNIT per tablet   Commonly known as: OSCAL WITH D   Take 1 tablet by mouth daily.      cholecalciferol 400 UNITS Tabs   Commonly known as: VITAMIN D   Take 400 Units by mouth daily.      Cranberry Extract 250 MG Tabs   Take 1 tablet by mouth daily as needed. For bladder      diclofenac 1.3 % Ptch   Commonly known as: FLECTOR   Place 1 patch onto the skin at bedtime.      diclofenac sodium 1 % Gel   Commonly known as: VOLTAREN   Apply 1 application topically at bedtime as needed. As needed for  muscle pain. Shoulder.      diphenhydramine-acetaminophen 25-500 MG Tabs   Commonly known as: TYLENOL PM   Take 1 tablet by mouth at bedtime as needed. As needed for sleep. Patient takes with Ambien.      dorzolamide 2 % ophthalmic solution   Commonly known as: TRUSOPT   Place 1 drop into both eyes 2 (two) times daily.      doxycycline 100 MG tablet   Commonly known as: VIBRA-TABS   Take 100 mg by mouth 2 (two) times daily.      fish oil-omega-3 fatty acids 1000 MG capsule   Take 1 g by mouth daily.      HYDROcodone-acetaminophen 5-325 MG per tablet   Commonly known as: NORCO   Take 1 tablet by mouth 4 (four) times daily. For pain      ICAPS PO   Take 1 capsule by mouth daily.      latanoprost 0.005 % ophthalmic solution   Commonly known as: XALATAN   Place 1 drop into both eyes at bedtime.      lidocaine 5 %   Commonly known as: LIDODERM   Place 1 patch onto the skin daily. Remove & Discard patch within 12 hours or as directed by MD      meloxicam 7.5 MG tablet   Commonly known as: MOBIC   Take 7.5 mg by mouth 2 (two) times daily.      mesalamine 400 MG EC tablet   Commonly known as: ASACOL   Take 800 mg by mouth 3 (three) times daily.      methocarbamol 500 MG tablet   Commonly known as: ROBAXIN   Take 500 mg by mouth 4 (four) times daily as needed. For spasms      mulitivitamin with minerals Tabs   Take 1 tablet by mouth daily.      ondansetron 4 MG tablet   Commonly known as: ZOFRAN   Take 4 mg by mouth every 8 (eight) hours as needed. As needed for nausea.      pantoprazole 40 MG tablet   Commonly known as: PROTONIX   Take 40 mg by mouth daily.      rifampin 300 MG  capsule   Commonly known as: RIFADIN   Take 1 capsule (300 mg total) by mouth daily.      tiotropium 18 MCG inhalation capsule   Commonly known as: SPIRIVA   Place 18 mcg into inhaler and inhale daily.      vancomycin 1 GM/200ML Soln   Commonly known as: VANCOCIN   Inject 200 mLs (1,000  mg total) into the vein daily.      vitamin C 500 MG tablet   Commonly known as: ASCORBIC ACID   Take 500 mg by mouth daily.      zolpidem 5 MG tablet   Commonly known as: AMBIEN   Take 5 mg by mouth at bedtime.            Diagnostic Studies: No results found.  Disposition: 01-Home or Self Care  Discharge Orders    Future Appointments: Provider: Department: Dept Phone: Center:   12/27/2011 11:15 AM Judyann Munson, MD [facility] [phone] [facility]     Future Orders Please Complete By Expires   Diet - low sodium heart healthy      Call MD / Call 911      Comments:   If you experience chest pain or shortness of breath, CALL 911 and be transported to the hospital emergency room.  If you develope a fever above 101 F, pus (white drainage) or increased drainage or redness at the wound, or calf pain, call your surgeon's office.   Constipation Prevention      Comments:   Drink plenty of fluids.  Prune juice may be helpful.  You may use a stool softener, such as Colace (over the counter) 100 mg twice a day.  Use MiraLax (over the counter) for constipation as needed.   Increase activity slowly as tolerated      Discharge instructions      Comments:   Dry dressing daily right hip incision. Call [facility] [phone] for a follow-up appointment in 2 weeks.         SignedKathryne Hitch 11/23/2011, 7:08 AM   `

## 2011-11-23 NOTE — Clinical Social Work Psychosocial (Signed)
     Clinical Social Work Department BRIEF PSYCHOSOCIAL ASSESSMENT 11/23/2011  Patient:  Bridget Duncan, Bridget Duncan     Account Number:  [id number]     Admit date:  11/18/2011  Clinical Social Worker:  Dennison Bulla  Date/Time:  11/23/2011 01:45 PM  Referred by:  RN  Date Referred:  11/23/2011 Referred for  SNF Placement   Other Referral:   Interview type:  Patient Other interview type:    PSYCHOSOCIAL DATA Living Status:  FAMILY Admitted from facility:   Level of care:   Primary support name:  Ronica Primary support relationship to patient:  CHILD, ADULT Degree of support available:   Strong    CURRENT CONCERNS Current Concerns  Post-Acute Placement   Other Concerns:    SOCIAL WORK ASSESSMENT / PLAN CSW spoke with RN who reported that patient changed her mind regarding wanting to go to SNF instead of home with dtr. CSW reviewed chart and met with patient at bedside.    Patient stated the plan was her for to return home with her dtr but reports that after having an accident this morning and unable to clean herself properly that she felt SNF would be more appropriate. Patient reports she would have to spend some time alone at home since her dtr works and would prefer SNF. Patient reports she has been to [facility] in the past and is only interested in this facility. CSW provided patient with SNF list and explained that patient would need to have alternative options in case [facility] did not have a bed available. CSW explained SNF process and received permission to fax out to [facility]. Patient reports that dtr is unaware of plan and reports that she will explain to dtr when she comes to visit tonight. Patient would not give CSW permission to call dtr and patient did not want to call dtr while she was at work.    CSW completed FL2 and faxed out. Patient has existing pasarr. CSW called [facility] and left a message regarding bed availability. CSW will continue to follow.   Assessment/plan status:   Psychosocial Support/Ongoing Assessment of Needs Other assessment/ plan:   Information/referral to community resources:   SNF list    PATIENTS/FAMILYS RESPONSE TO PLAN OF CARE: Patient was alert and oriented. Patient is agreeable to SNF but prefers [facility]. Patient is aware that if [facility] does not have space then her option is either to go home or have another facility to go to. Patient reports she will speak with dtr.

## 2011-11-24 LAB — ANAEROBIC CULTURE

## 2011-11-24 MED ORDER — DIPHENHYDRAMINE HCL 25 MG PO CAPS: 25.0000 mg | ORAL_CAPSULE | Freq: Two times a day (BID) | ORAL | Status: DC

## 2011-11-24 MED ORDER — SODIUM CHLORIDE 0.9 % IV SOLN
750.0000 mg | Freq: Two times a day (BID) | INTRAVENOUS | Status: DC
  Filled 2011-11-24: qty 750

## 2011-11-24 MED ORDER — HEPARIN SOD (PORK) LOCK FLUSH 100 UNIT/ML IV SOLN
250.0000 [IU] | INTRAVENOUS | Status: DC | PRN
  Administered 2011-11-24: 250 [IU]
  Filled 2011-11-24: qty 3

## 2011-11-24 MED ORDER — HEPARIN SOD (PORK) LOCK FLUSH 100 UNIT/ML IV SOLN
250.0000 [IU] | Freq: Every day | INTRAVENOUS | Status: DC
  Filled 2011-11-24: qty 3

## 2011-11-24 MED ORDER — HYDROCODONE-ACETAMINOPHEN 5-325 MG PO TABS: 1.0000 | ORAL_TABLET | Freq: Four times a day (QID) | ORAL | Status: AC

## 2011-11-24 MED ORDER — VANCOMYCIN HCL 500 MG IV SOLR: 500.0000 mg | Freq: Two times a day (BID) | INTRAVENOUS | Status: DC

## 2011-11-24 NOTE — Progress Notes (Signed)
Patient ID: Bridget Duncan, female   DOB: [birth year], 78 y.o.   MRN: [id number] Can discharge to skilled nursing today.

## 2011-11-24 NOTE — Discharge Planning (Signed)
Report called to Amy at [facility].  Doyle Askew, RN

## 2011-11-24 NOTE — Progress Notes (Signed)
CARE MANAGEMENT NOTE 11/24/2011  Patient:  Bridget Duncan   Account Number:  [id number]  Date Initiated:  11/21/2011  Documentation initiated by:  Donn Pierini  Subjective/Objective Assessment:   Pt admitted with Rhabdo and cellulitis     Action/Plan:   PTA pt lived at home was independent with ADLs   Anticipated DC Date:  11/24/2011   Anticipated DC Plan:  SKILLED NURSING FACILITY  In-house referral  Clinical Social Worker      DC Planning Services  CM consult      Choice offered to / List presented to:  C-1 Patient           Status of service:  Completed, signed off Medicare Important Message given?   (If response is "NO", the following Medicare IM given date fields will be blank) Date Medicare IM given:   Date Additional Medicare IM given:    Discharge Disposition:  SKILLED NURSING FACILITY  Per UR Regulation:  Reviewed for med. necessity/level of care/duration of stay  If discussed at Long Length of Stay Meetings, dates discussed:    Comments:  11/24/11 10:11 Vance Peper, RN BSN Patient will discharge to [facility].

## 2011-11-24 NOTE — Progress Notes (Signed)
PT Cancel Note:    PT session cancelled due to RN reports ambulance has been called & is on the way to transport pt to next venue.     Verdell Face, [place] [phone] 11/24/2011

## 2011-11-24 NOTE — Discharge Summary (Signed)
Patient ID: Bridget Duncan MRN: [id number] DOB/AGE: 76/22/1927 76 y.o.  Admit date: 11/18/2011 Discharge date: 11/24/2011  Admission Diagnoses:  Principal Problem:  *Infected prosthetic hip   Discharge Diagnoses:  Same  Past Medical History  Diagnosis Date  . Arthritis   . Osteoporosis   . Hypertension   . Paresthesia   . 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 Shortness of breath     with activity  . Asthma   . GERD (gastroesophageal reflux disease)   . COPD (chronic obstructive pulmonary disease)     sees Dr. Loraine Leriche Donor  . Emphysema     hx of  . Ulcerative colitis     hx of  . Neuromuscular disorder     hx of parestesia    Surgeries: Procedure(s): IRRIGATION AND DEBRIDEMENT HIP on 11/18/2011 - 11/19/2011   Consultants: Treatment Team:  Shirley Friar, MD Barrie Folk, MD  Discharged Condition: Improved  Hospital Course: Bridget Duncan is an 78 y.o. female who was admitted 11/18/2011 for operative treatment ofInfected prosthetic hip. Patient has severe unremitting pain that affects sleep, daily activities, and work/hobbies. After pre-op clearance the patient was taken to the operating room on 11/18/2011 - 11/19/2011 and underwent  Procedure(s): IRRIGATION AND DEBRIDEMENT HIP.    Patient was given perioperative antibiotics: Anti-infectives     Start     Dose/Rate Route Frequency Ordered Stop   11/24/11 0000   vancomycin (VANCOCIN) 500 mg in sodium chloride 0.9 % 100 mL IVPB        500 mg 100 mL/hr over 60 Minutes Intravenous Every 12 hours 11/23/11 1423     11/24/11 0000   sodium chloride 0.9 % SOLN 100 mL with vancomycin 500 MG SOLR 500 mg        500 mg 100 mL/hr over 60 Minutes Intravenous Every 12 hours 11/24/11 0704     11/23/11 0000   vancomycin (VANCOCIN) 1 GM/200ML SOLN        1,000 mg 200 mL/hr over 60 Minutes Intravenous Every 24 hours 11/23/11 0707     11/23/11 0000   rifampin (RIFADIN) 300 MG capsule        300 mg  Oral Daily 11/23/11 0707 12/03/11 2359   11/22/11 1600   rifampin (RIFADIN) capsule 300 mg        300 mg Oral Daily 11/22/11 1447 01/01/12 0959   11/19/11 1300   vancomycin (VANCOCIN) IVPB 1000 mg/200 mL premix  Status:  Discontinued        1,000 mg 200 mL/hr over 60 Minutes Intravenous Every 24 hours 11/19/11 1134 11/23/11 1422           Patient was given sequential compression devices, early ambulation, and chemoprophylaxis to prevent DVT.  Patient benefited maximally from hospital stay and there were no complications.    Recent vital signs: Patient Vitals for the past 24 hrs:  BP Temp Temp src Pulse Resp SpO2  11/23/11 2200 106/68 mmHg 99 F (37.2 C) Oral 92  18  98 %  11/23/11 1432 - - - 89  16  96 %  11/23/11 1420 110/68 mmHg 98.6 F (37 C) - 90  18  97 %  11/23/11 0841 - - - - - 96 %     Recent laboratory studies:  Basename 11/23/11 0540  WBC --  HGB --  HCT --  PLT --  NA 138  K 3.7  CL 104  CO2 28  BUN  19  CREATININE 0.66  GLUCOSE 104*  INR --  CALCIUM 9.1     Discharge Medications:   Medication List  As of 11/24/2011  7:05 AM   TAKE these medications         albuterol 108 (90 BASE) MCG/ACT inhaler   Commonly known as: PROVENTIL HFA;VENTOLIN HFA   Inhale 2 puffs into the lungs every 6 (six) hours as needed. For wheezing      albuterol (2.5 MG/3ML) 0.083% nebulizer solution   Commonly known as: PROVENTIL   Take 2.5 mg by nebulization every 6 (six) hours as needed. For wheezing      ALIGN 4 MG Caps   Take 1 capsule by mouth daily.      arformoterol 15 MCG/2ML Nebu   Commonly known as: BROVANA   Take 15 mcg by nebulization 2 (two) times daily.      atorvastatin 10 MG tablet   Commonly known as: LIPITOR   Take 5 mg by mouth 3 (three) times a week. On Monday, Wednesday, and Friday.      calcium-vitamin D 500-200 MG-UNIT per tablet   Commonly known as: OSCAL WITH D   Take 1 tablet by mouth daily.      cholecalciferol 400 UNITS Tabs   Commonly  known as: VITAMIN D   Take 400 Units by mouth daily.      Cranberry Extract 250 MG Tabs   Take 1 tablet by mouth daily as needed. For bladder      diclofenac 1.3 % Ptch   Commonly known as: FLECTOR   Place 1 patch onto the skin at bedtime.      diclofenac sodium 1 % Gel   Commonly known as: VOLTAREN   Apply 1 application topically at bedtime as needed. As needed for muscle pain. Shoulder.      diphenhydramine-acetaminophen 25-500 MG Tabs   Commonly known as: TYLENOL PM   Take 1 tablet by mouth at bedtime as needed. As needed for sleep. Patient takes with Ambien.      dorzolamide 2 % ophthalmic solution   Commonly known as: TRUSOPT   Place 1 drop into both eyes 2 (two) times daily.      doxycycline 100 MG tablet   Commonly known as: VIBRA-TABS   Take 100 mg by mouth 2 (two) times daily.      fish oil-omega-3 fatty acids 1000 MG capsule   Take 1 g by mouth daily.      HYDROcodone-acetaminophen 5-325 MG per tablet   Commonly known as: NORCO   Take 1 tablet by mouth 4 (four) times daily. For pain      ICAPS PO   Take 1 capsule by mouth daily.      latanoprost 0.005 % ophthalmic solution   Commonly known as: XALATAN   Place 1 drop into both eyes at bedtime.      lidocaine 5 %   Commonly known as: LIDODERM   Place 1 patch onto the skin daily. Remove & Discard patch within 12 hours or as directed by MD      meloxicam 7.5 MG tablet   Commonly known as: MOBIC   Take 7.5 mg by mouth 2 (two) times daily.      mesalamine 400 MG EC tablet   Commonly known as: ASACOL   Take 800 mg by mouth 3 (three) times daily.      methocarbamol 500 MG tablet   Commonly known as: ROBAXIN   Take 500 mg by mouth  4 (four) times daily as needed. For spasms      mulitivitamin with minerals Tabs   Take 1 tablet by mouth daily.      ondansetron 4 MG tablet   Commonly known as: ZOFRAN   Take 4 mg by mouth every 8 (eight) hours as needed. As needed for nausea.      pantoprazole 40 MG tablet     Commonly known as: PROTONIX   Take 40 mg by mouth daily.      rifampin 300 MG capsule   Commonly known as: RIFADIN   Take 1 capsule (300 mg total) by mouth daily.      sodium chloride 0.9 % SOLN 100 mL with vancomycin 500 MG SOLR 500 mg   Inject 500 mg into the vein every 12 (twelve) hours.      tiotropium 18 MCG inhalation capsule   Commonly known as: SPIRIVA   Place 18 mcg into inhaler and inhale daily.      vancomycin 1 GM/200ML Soln   Commonly known as: VANCOCIN   Inject 200 mLs (1,000 mg total) into the vein daily.      vitamin C 500 MG tablet   Commonly known as: ASCORBIC ACID   Take 500 mg by mouth daily.      zolpidem 5 MG tablet   Commonly known as: AMBIEN   Take 5 mg by mouth at bedtime.            Diagnostic Studies: No results found.  Disposition: to skilled nursing facility  Discharge Orders    Future Appointments: Provider: Department: Dept Phone: Center:   12/27/2011 11:15 AM Judyann Munson, MD [facility] [phone] [facility]     Future Orders Please Complete By Expires   Diet - low sodium heart healthy      Diet - low sodium heart healthy      Call MD / Call 911      Comments:   If you experience chest pain or shortness of breath, CALL 911 and be transported to the hospital emergency room.  If you develope a fever above 101 F, pus (white drainage) or increased drainage or redness at the wound, or calf pain, call your surgeon's office.   Constipation Prevention      Comments:   Drink plenty of fluids.  Prune juice may be helpful.  You may use a stool softener, such as Colace (over the counter) 100 mg twice a day.  Use MiraLax (over the counter) for constipation as needed.   Increase activity slowly as tolerated      Discharge instructions      Comments:   Dry dressing daily right hip incision. Call [facility] [phone] for a follow-up appointment in 2 weeks.   Call MD / Call 911      Comments:   If you experience chest pain or  shortness of breath, CALL 911 and be transported to the hospital emergency room.  If you develope a fever above 101 F, pus (white drainage) or increased drainage or redness at the wound, or calf pain, call your surgeon's office.   Constipation Prevention      Comments:   Drink plenty of fluids.  Prune juice may be helpful.  You may use a stool softener, such as Colace (over the counter) 100 mg twice a day.  Use MiraLax (over the counter) for constipation as needed.   Increase activity slowly as tolerated      Discharge instructions  Comments:   Dry dressing daily right hip incision. Weight-bearing as tolerated right hip. Can get incision wet in the shower starting 11/26/11. Follow-up at [facility] in 2 weeks [phone])   Discharge patient            Signed: Kathryne Hitch 11/24/2011, 7:05 AM

## 2011-12-27 ENCOUNTER — Inpatient Hospital Stay: Payer: Medicare Other | Admitting: Internal Medicine

## 2011-12-27 ENCOUNTER — Telehealth: Payer: Self-pay | Admitting: *Deleted

## 2011-12-27 NOTE — Telephone Encounter (Signed)
Called patient to try and reschedule the appt she missed this morning and was unable to leave a message as her voice mailbox was not set up.

## 2012-01-04 ENCOUNTER — Telehealth: Payer: Self-pay | Admitting: *Deleted

## 2012-01-04 NOTE — Telephone Encounter (Signed)
Called patient and her daughter answered and advised that the patient is in a facility at this time and will not be able to come in for a follow-up visit. Advised daughter (also Kaeleigh) to call if at any time they find that she does need to see our provider.

## 2012-01-31 ENCOUNTER — Encounter (HOSPITAL_BASED_OUTPATIENT_CLINIC_OR_DEPARTMENT_OTHER): Payer: Medicare Other | Attending: General Surgery

## 2012-01-31 DIAGNOSIS — J449 Chronic obstructive pulmonary disease, unspecified: Secondary | ICD-10-CM | POA: Insufficient documentation

## 2012-01-31 DIAGNOSIS — M810 Age-related osteoporosis without current pathological fracture: Secondary | ICD-10-CM | POA: Insufficient documentation

## 2012-01-31 DIAGNOSIS — Z79899 Other long term (current) drug therapy: Secondary | ICD-10-CM | POA: Insufficient documentation

## 2012-01-31 DIAGNOSIS — S71009A Unspecified open wound, unspecified hip, initial encounter: Secondary | ICD-10-CM | POA: Insufficient documentation

## 2012-01-31 DIAGNOSIS — Z96649 Presence of unspecified artificial hip joint: Secondary | ICD-10-CM | POA: Insufficient documentation

## 2012-01-31 DIAGNOSIS — J4489 Other specified chronic obstructive pulmonary disease: Secondary | ICD-10-CM | POA: Insufficient documentation

## 2012-01-31 DIAGNOSIS — Y838 Other surgical procedures as the cause of abnormal reaction of the patient, or of later complication, without mention of misadventure at the time of the procedure: Secondary | ICD-10-CM | POA: Insufficient documentation

## 2012-01-31 DIAGNOSIS — S71109A Unspecified open wound, unspecified thigh, initial encounter: Secondary | ICD-10-CM | POA: Insufficient documentation

## 2012-01-31 DIAGNOSIS — Z9981 Dependence on supplemental oxygen: Secondary | ICD-10-CM | POA: Insufficient documentation

## 2012-01-31 NOTE — H&P (Signed)
NAME:  Bridget Duncan, Bridget Duncan NO.:  [id number]  MEDICAL RECORD NO.:  [id number]  LOCATION:  FOOT                         FACILITY:  [facility]  PHYSICIAN:  Joanne Gavel, M.D.        DATE OF BIRTH:  Nov 02, 1925  DATE OF ADMISSION:  01/31/2012 DATE OF DISCHARGE:                             HISTORY & PHYSICAL   CHIEF COMPLAINT:  Wound, right hip.  HISTORY OF PRESENT ILLNESS:  This is an 78 year old female who was had several operations done after hip replacement 5 years ago.  The last operation was in May 2013.  She has had pieces of hardware removed, she has had antibiotic beads placed in the wound bed, and she has had various incisions and drainages.  She has been on multiple antibiotics.  Past medical history is significant for COPD, she is oxygen dependent, hard of hearing, osteoarthritis, osteoporosis, glaucoma and spinal stenosis.  PAST SURGICAL HISTORY:  Right hip replacement with multiple re- explorations and cataract surgery.  ALLERGIES:  VANCOMYCIN causes hives.  MEDICATIONS:  Doxycycline, Cipro, rifampin, Norco, meloxicam, Florastor, metoprolol, atorvastatin, Ambien, Xalatan, Spiriva, Pepcid, Asacol, Proventil and methocarbamol.  She is also on multiple vitamins.  REVIEW OF SYSTEMS:  Essentially as above.  The patient does ambulate with a walker.  PHYSICAL EXAMINATION:  VITAL SIGNS:  Temperature 98.2, pulse 73, respirations 18, blood pressure 141/85. GENERAL:  Well developed, very slender elderly female, seems well oriented. CHEST:  Clear. HEART:  Regular rhythm. SKIN:  Examination of the hips reveals a wound 0.3 x 0.3 which probes to a depth of approximately 1 cm and there is a small cavity about 1 cm underneath the middle of a long right hip wound.  IMPRESSION:  Nonhealed surgical wound which has been opened many times before.  PLAN:  We will pack with silver alginate for now and observe and decide whether or not the wound needs to be re-explored  superficially.  We will see her in 7 days.     Joanne Gavel, M.D.     RA/MEDQ  D:  01/31/2012  T:  01/31/2012  Job:  [id number]  cc:   Dr. Magnus Ivan

## 2012-02-21 ENCOUNTER — Encounter (HOSPITAL_BASED_OUTPATIENT_CLINIC_OR_DEPARTMENT_OTHER): Payer: Medicare Other | Attending: General Surgery

## 2012-02-21 DIAGNOSIS — L0889 Other specified local infections of the skin and subcutaneous tissue: Secondary | ICD-10-CM | POA: Insufficient documentation

## 2012-02-21 DIAGNOSIS — S71009A Unspecified open wound, unspecified hip, initial encounter: Secondary | ICD-10-CM | POA: Insufficient documentation

## 2012-02-21 DIAGNOSIS — Y838 Other surgical procedures as the cause of abnormal reaction of the patient, or of later complication, without mention of misadventure at the time of the procedure: Secondary | ICD-10-CM | POA: Insufficient documentation

## 2012-02-21 DIAGNOSIS — S71109A Unspecified open wound, unspecified thigh, initial encounter: Secondary | ICD-10-CM | POA: Insufficient documentation

## 2012-02-28 ENCOUNTER — Encounter (HOSPITAL_BASED_OUTPATIENT_CLINIC_OR_DEPARTMENT_OTHER): Payer: Medicare Other

## 2012-02-28 NOTE — Progress Notes (Signed)
[facility]  NAME:  Bridget Duncan, Bridget Duncan NO.:  [id number]  MEDICAL RECORD NO.:  [id number]      DATE OF BIRTH:  1925/11/15  PHYSICIAN:  Ardath Sax, M.D.           VISIT DATE:                                  OFFICE VISIT   She is an 78 year old lady who has had problems for the last 7 years ever since she fell and fractured her right femur and had a total hip replacement for this condition.  She has had chronic infection ever since that time and has had multiple procedures where Dr. Magnus Ivan has gone in and cleaned this area out and irrigated it and closed it hoping to rid the patient of infection.  She has had both IV antibiotics and oral antibiotics.  In fact she presently is on Cipro and doxycycline. She is also followed by infectious disease.  The wound is well healed except for 1 spot in the upper portion of the wound which has about a 5 or 6 mm opening that drops down about 2 cm towards the femur.  Today, we packed it with Silvercel and put on a bulky dressing as it does drain quite a bit.  She has continued on her Cipro and doxycycline and I had a talk with the patient and her relative as to the best way to go about following this.  I think with the idea that she is 16 and she is able to function and get around on a walker, just change the dressing all the time and take dressing changes and keep it clean and take antibiotics that might be better than taking her back to surgery, taking out the hardware and having to wait some time before reoperating.  The son agrees with me and that is the way we are going to proceed.  We will continue to watch her and I will attempt to talk to Dr. Magnus Ivan about this problem.     Ardath Sax, M.D.     PP/MEDQ  D:  02/28/2012  T:  02/28/2012  Job:  [id number]

## 2012-03-20 ENCOUNTER — Encounter (HOSPITAL_BASED_OUTPATIENT_CLINIC_OR_DEPARTMENT_OTHER): Payer: Medicare Other | Attending: General Surgery

## 2012-04-24 ENCOUNTER — Encounter (HOSPITAL_BASED_OUTPATIENT_CLINIC_OR_DEPARTMENT_OTHER): Payer: Medicare Other

## 2012-07-02 ENCOUNTER — Encounter (HOSPITAL_COMMUNITY): Payer: Self-pay | Admitting: Emergency Medicine

## 2012-07-02 ENCOUNTER — Emergency Department (HOSPITAL_COMMUNITY): Payer: Medicare Other

## 2012-07-02 ENCOUNTER — Inpatient Hospital Stay (HOSPITAL_COMMUNITY)
Admission: EM | Admit: 2012-07-02 | Discharge: 2012-07-06 | DRG: 190 | Disposition: A | Discharge status: Skilled Nursing Facility | Payer: Medicare Other | Attending: Internal Medicine | Admitting: Internal Medicine

## 2012-07-02 DIAGNOSIS — N182 Chronic kidney disease, stage 2 (mild): Secondary | ICD-10-CM | POA: Diagnosis present

## 2012-07-02 DIAGNOSIS — I129 Hypertensive chronic kidney disease with stage 1 through stage 4 chronic kidney disease, or unspecified chronic kidney disease: Secondary | ICD-10-CM | POA: Diagnosis present

## 2012-07-02 DIAGNOSIS — I5032 Chronic diastolic (congestive) heart failure: Secondary | ICD-10-CM | POA: Diagnosis present

## 2012-07-02 DIAGNOSIS — Z87891 Personal history of nicotine dependence: Secondary | ICD-10-CM

## 2012-07-02 DIAGNOSIS — J441 Chronic obstructive pulmonary disease with (acute) exacerbation: Secondary | ICD-10-CM | POA: Diagnosis present

## 2012-07-02 DIAGNOSIS — T8450XA Infection and inflammatory reaction due to unspecified internal joint prosthesis, initial encounter: Secondary | ICD-10-CM

## 2012-07-02 DIAGNOSIS — E873 Alkalosis: Secondary | ICD-10-CM | POA: Diagnosis present

## 2012-07-02 DIAGNOSIS — I509 Heart failure, unspecified: Secondary | ICD-10-CM | POA: Diagnosis present

## 2012-07-02 DIAGNOSIS — T8451XA Infection and inflammatory reaction due to internal right hip prosthesis, initial encounter: Secondary | ICD-10-CM

## 2012-07-02 DIAGNOSIS — R918 Other nonspecific abnormal finding of lung field: Secondary | ICD-10-CM | POA: Diagnosis present

## 2012-07-02 DIAGNOSIS — J9620 Acute and chronic respiratory failure, unspecified whether with hypoxia or hypercapnia: Secondary | ICD-10-CM | POA: Diagnosis present

## 2012-07-02 DIAGNOSIS — B999 Unspecified infectious disease: Secondary | ICD-10-CM | POA: Diagnosis present

## 2012-07-02 DIAGNOSIS — K5190 Ulcerative colitis, unspecified, without complications: Secondary | ICD-10-CM | POA: Diagnosis present

## 2012-07-02 DIAGNOSIS — Z66 Do not resuscitate: Secondary | ICD-10-CM | POA: Diagnosis present

## 2012-07-02 DIAGNOSIS — M810 Age-related osteoporosis without current pathological fracture: Secondary | ICD-10-CM | POA: Diagnosis present

## 2012-07-02 DIAGNOSIS — Z9981 Dependence on supplemental oxygen: Secondary | ICD-10-CM

## 2012-07-02 DIAGNOSIS — Y831 Surgical operation with implant of artificial internal device as the cause of abnormal reaction of the patient, or of later complication, without mention of misadventure at the time of the procedure: Secondary | ICD-10-CM | POA: Diagnosis present

## 2012-07-02 DIAGNOSIS — T847XXA Infection and inflammatory reaction due to other internal orthopedic prosthetic devices, implants and grafts, initial encounter: Secondary | ICD-10-CM | POA: Diagnosis present

## 2012-07-02 DIAGNOSIS — K219 Gastro-esophageal reflux disease without esophagitis: Secondary | ICD-10-CM | POA: Diagnosis present

## 2012-07-02 DIAGNOSIS — Z96649 Presence of unspecified artificial hip joint: Secondary | ICD-10-CM

## 2012-07-02 DIAGNOSIS — Y92009 Unspecified place in unspecified non-institutional (private) residence as the place of occurrence of the external cause: Secondary | ICD-10-CM

## 2012-07-02 DIAGNOSIS — J9610 Chronic respiratory failure, unspecified whether with hypoxia or hypercapnia: Secondary | ICD-10-CM

## 2012-07-02 LAB — BLOOD GAS, ARTERIAL
Acid-Base Excess: 14.4 mmol/L — ABNORMAL HIGH (ref 0.0–2.0)
Bicarbonate: 40.2 mEq/L — ABNORMAL HIGH (ref 20.0–24.0)
Patient temperature: 98.6
TCO2: 42.3 mmol/L (ref 0–100)
pH, Arterial: 7.384 (ref 7.350–7.450)

## 2012-07-02 LAB — CBC WITH DIFFERENTIAL/PLATELET
Basophils Absolute: 0 10*3/uL (ref 0.0–0.1)
Basophils Relative: 0 % (ref 0–1)
Hemoglobin: 11.6 g/dL — ABNORMAL LOW (ref 12.0–15.0)
MCHC: 28.2 g/dL — ABNORMAL LOW (ref 30.0–36.0)
Neutro Abs: 7.9 10*3/uL — ABNORMAL HIGH (ref 1.7–7.7)
Neutrophils Relative %: 75 % (ref 43–77)
Platelets: 328 10*3/uL (ref 150–400)
RDW: 15.5 % (ref 11.5–15.5)

## 2012-07-02 LAB — COMPREHENSIVE METABOLIC PANEL
AST: 63 U/L — ABNORMAL HIGH (ref 0–37)
Albumin: 2.8 g/dL — ABNORMAL LOW (ref 3.5–5.2)
Alkaline Phosphatase: 35 U/L — ABNORMAL LOW (ref 39–117)
Chloride: 100 mEq/L (ref 96–112)
Potassium: 3.7 mEq/L (ref 3.5–5.1)
Sodium: 144 mEq/L (ref 135–145)
Total Bilirubin: 0.3 mg/dL (ref 0.3–1.2)

## 2012-07-02 LAB — TSH: TSH: 0.925 u[IU]/mL (ref 0.350–4.500)

## 2012-07-02 MED ORDER — ARFORMOTEROL TARTRATE 15 MCG/2ML IN NEBU
15.0000 ug | INHALATION_SOLUTION | Freq: Two times a day (BID) | RESPIRATORY_TRACT | Status: DC
  Administered 2012-07-02 – 2012-07-06 (×7): 15 ug via RESPIRATORY_TRACT
  Filled 2012-07-02 (×13): qty 2

## 2012-07-02 MED ORDER — HYDROCODONE-ACETAMINOPHEN 5-325 MG PO TABS
1.0000 | ORAL_TABLET | Freq: Four times a day (QID) | ORAL | Status: DC
  Administered 2012-07-02 – 2012-07-06 (×16): 1 via ORAL
  Filled 2012-07-02 (×16): qty 1

## 2012-07-02 MED ORDER — ALBUTEROL SULFATE (5 MG/ML) 0.5% IN NEBU
2.5000 mg | INHALATION_SOLUTION | RESPIRATORY_TRACT | Status: DC | PRN
  Administered 2012-07-02: 2.5 mg via RESPIRATORY_TRACT
  Filled 2012-07-02: qty 0.5

## 2012-07-02 MED ORDER — ONDANSETRON HCL 4 MG/2ML IJ SOLN: 4.0000 mg | Freq: Four times a day (QID) | INTRAMUSCULAR | Status: DC | PRN

## 2012-07-02 MED ORDER — DICLOFENAC EPOLAMINE 1.3 % TD PTCH
1.0000 | MEDICATED_PATCH | Freq: Every day | TRANSDERMAL | Status: DC | PRN
  Filled 2012-07-02: qty 1

## 2012-07-02 MED ORDER — ACETAMINOPHEN 325 MG PO TABS
650.0000 mg | ORAL_TABLET | Freq: Four times a day (QID) | ORAL | Status: DC | PRN
  Filled 2012-07-02: qty 2

## 2012-07-02 MED ORDER — ACETAMINOPHEN 650 MG RE SUPP: 650.0000 mg | Freq: Four times a day (QID) | RECTAL | Status: DC | PRN

## 2012-07-02 MED ORDER — PANTOPRAZOLE SODIUM 40 MG PO TBEC
40.0000 mg | DELAYED_RELEASE_TABLET | Freq: Every day | ORAL | Status: DC
  Administered 2012-07-02 – 2012-07-06 (×5): 40 mg via ORAL
  Filled 2012-07-02 (×6): qty 1

## 2012-07-02 MED ORDER — SUCRALFATE 1 G PO TABS
1.0000 g | ORAL_TABLET | Freq: Three times a day (TID) | ORAL | Status: DC
  Administered 2012-07-02 – 2012-07-06 (×15): 1 g via ORAL
  Filled 2012-07-02 (×22): qty 1

## 2012-07-02 MED ORDER — LIDOCAINE 5 % EX PTCH
1.0000 | MEDICATED_PATCH | CUTANEOUS | Status: DC
  Administered 2012-07-02 – 2012-07-06 (×5): 1 via TRANSDERMAL
  Filled 2012-07-02 (×5): qty 1

## 2012-07-02 MED ORDER — GUAIFENESIN ER 600 MG PO TB12
1200.0000 mg | ORAL_TABLET | Freq: Two times a day (BID) | ORAL | Status: DC
  Administered 2012-07-02 – 2012-07-06 (×9): 1200 mg via ORAL
  Filled 2012-07-02 (×11): qty 2

## 2012-07-02 MED ORDER — DOXYCYCLINE HYCLATE 100 MG PO TABS
100.0000 mg | ORAL_TABLET | Freq: Two times a day (BID) | ORAL | Status: DC
  Administered 2012-07-02 – 2012-07-06 (×9): 100 mg via ORAL
  Filled 2012-07-02 (×11): qty 1

## 2012-07-02 MED ORDER — ONDANSETRON HCL 4 MG PO TABS: 4.0000 mg | ORAL_TABLET | Freq: Four times a day (QID) | ORAL | Status: DC | PRN

## 2012-07-02 MED ORDER — IPRATROPIUM BROMIDE 0.02 % IN SOLN
0.5000 mg | Freq: Four times a day (QID) | RESPIRATORY_TRACT | Status: DC
  Administered 2012-07-02: 0.5 mg via RESPIRATORY_TRACT

## 2012-07-02 MED ORDER — ALBUTEROL SULFATE (5 MG/ML) 0.5% IN NEBU
5.0000 mg | INHALATION_SOLUTION | Freq: Once | RESPIRATORY_TRACT | Status: AC
  Administered 2012-07-02: 5 mg via RESPIRATORY_TRACT

## 2012-07-02 MED ORDER — MORPHINE SULFATE 2 MG/ML IJ SOLN: 1.0000 mg | INTRAMUSCULAR | Status: DC | PRN

## 2012-07-02 MED ORDER — ZOLPIDEM TARTRATE 5 MG PO TABS
5.0000 mg | ORAL_TABLET | Freq: Every day | ORAL | Status: DC
  Administered 2012-07-02 – 2012-07-05 (×4): 5 mg via ORAL
  Filled 2012-07-02 (×4): qty 1

## 2012-07-02 MED ORDER — VITAMIN C 500 MG PO TABS
500.0000 mg | ORAL_TABLET | Freq: Every day | ORAL | Status: DC
  Administered 2012-07-02 – 2012-07-06 (×5): 500 mg via ORAL
  Filled 2012-07-02 (×5): qty 1

## 2012-07-02 MED ORDER — FUROSEMIDE 10 MG/ML IJ SOLN
40.0000 mg | Freq: Once | INTRAMUSCULAR | Status: AC
  Administered 2012-07-02: 40 mg via INTRAVENOUS
  Filled 2012-07-02: qty 4

## 2012-07-02 MED ORDER — MESALAMINE 400 MG PO CPDR
800.0000 mg | DELAYED_RELEASE_CAPSULE | Freq: Three times a day (TID) | ORAL | Status: DC
  Administered 2012-07-02 – 2012-07-06 (×12): 800 mg via ORAL
  Filled 2012-07-02 (×16): qty 2

## 2012-07-02 MED ORDER — ALBUTEROL SULFATE (5 MG/ML) 0.5% IN NEBU
2.5000 mg | INHALATION_SOLUTION | Freq: Three times a day (TID) | RESPIRATORY_TRACT | Status: DC
  Administered 2012-07-02 – 2012-07-06 (×13): 2.5 mg via RESPIRATORY_TRACT
  Filled 2012-07-02 (×14): qty 0.5

## 2012-07-02 MED ORDER — ALBUTEROL SULFATE (5 MG/ML) 0.5% IN NEBU
5.0000 mg | INHALATION_SOLUTION | RESPIRATORY_TRACT | Status: DC
  Administered 2012-07-02 (×2): 5 mg via RESPIRATORY_TRACT
  Filled 2012-07-02: qty 1
  Filled 2012-07-02: qty 0.5
  Filled 2012-07-02 (×2): qty 1

## 2012-07-02 MED ORDER — METHYLPREDNISOLONE SODIUM SUCC 125 MG IJ SOLR
125.0000 mg | Freq: Once | INTRAMUSCULAR | Status: AC
  Administered 2012-07-02: 125 mg via INTRAVENOUS
  Filled 2012-07-02: qty 2

## 2012-07-02 MED ORDER — MAGNESIUM OXIDE 400 (241.3 MG) MG PO TABS
400.0000 mg | ORAL_TABLET | Freq: Every day | ORAL | Status: DC
  Administered 2012-07-02 – 2012-07-06 (×5): 400 mg via ORAL
  Filled 2012-07-02 (×7): qty 1

## 2012-07-02 MED ORDER — IPRATROPIUM BROMIDE 0.02 % IN SOLN
0.5000 mg | RESPIRATORY_TRACT | Status: DC
  Administered 2012-07-02: 0.5 mg via RESPIRATORY_TRACT
  Filled 2012-07-02 (×2): qty 2.5

## 2012-07-02 MED ORDER — TIOTROPIUM BROMIDE MONOHYDRATE 18 MCG IN CAPS: 18.0000 ug | ORAL_CAPSULE | Freq: Every day | RESPIRATORY_TRACT | Status: DC

## 2012-07-02 MED ORDER — IPRATROPIUM BROMIDE 0.02 % IN SOLN
0.5000 mg | Freq: Three times a day (TID) | RESPIRATORY_TRACT | Status: DC
  Administered 2012-07-02 – 2012-07-06 (×13): 0.5 mg via RESPIRATORY_TRACT
  Filled 2012-07-02 (×14): qty 2.5

## 2012-07-02 MED ORDER — ATORVASTATIN CALCIUM 10 MG PO TABS
5.0000 mg | ORAL_TABLET | ORAL | Status: DC
  Administered 2012-07-02 – 2012-07-04 (×2): 5 mg via ORAL
  Filled 2012-07-02 (×3): qty 0.5

## 2012-07-02 MED ORDER — CHOLECALCIFEROL 10 MCG (400 UNIT) PO TABS
400.0000 [IU] | ORAL_TABLET | Freq: Every day | ORAL | Status: DC
  Administered 2012-07-03 – 2012-07-06 (×4): 400 [IU] via ORAL
  Filled 2012-07-02 (×5): qty 1

## 2012-07-02 MED ORDER — SODIUM CHLORIDE 0.9 % IJ SOLN
3.0000 mL | Freq: Two times a day (BID) | INTRAMUSCULAR | Status: DC
  Administered 2012-07-02 – 2012-07-06 (×9): 3 mL via INTRAVENOUS

## 2012-07-02 MED ORDER — DORZOLAMIDE HCL 2 % OP SOLN
1.0000 [drp] | Freq: Two times a day (BID) | OPHTHALMIC | Status: DC
  Administered 2012-07-02 – 2012-07-06 (×9): 1 [drp] via OPHTHALMIC
  Filled 2012-07-02 (×2): qty 10

## 2012-07-02 MED ORDER — METOPROLOL TARTRATE 25 MG PO TABS
25.0000 mg | ORAL_TABLET | Freq: Two times a day (BID) | ORAL | Status: DC
  Administered 2012-07-02 – 2012-07-06 (×9): 25 mg via ORAL
  Filled 2012-07-02 (×11): qty 1

## 2012-07-02 MED ORDER — METHYLPREDNISOLONE SODIUM SUCC 125 MG IJ SOLR
80.0000 mg | Freq: Three times a day (TID) | INTRAMUSCULAR | Status: DC
  Administered 2012-07-02 – 2012-07-04 (×7): 80 mg via INTRAVENOUS
  Filled 2012-07-02 (×3): qty 1.28
  Filled 2012-07-02: qty 2
  Filled 2012-07-02 (×8): qty 1.28

## 2012-07-02 MED ORDER — CALCIUM CARBONATE-VITAMIN D 500-200 MG-UNIT PO TABS
1.0000 | ORAL_TABLET | Freq: Every day | ORAL | Status: DC
  Administered 2012-07-02 – 2012-07-06 (×5): 1 via ORAL
  Filled 2012-07-02 (×7): qty 1

## 2012-07-02 MED ORDER — HEPARIN SODIUM (PORCINE) 5000 UNIT/ML IJ SOLN
5000.0000 [IU] | Freq: Three times a day (TID) | INTRAMUSCULAR | Status: DC
  Administered 2012-07-02 – 2012-07-05 (×9): 5000 [IU] via SUBCUTANEOUS
  Filled 2012-07-02 (×14): qty 1

## 2012-07-02 MED ORDER — RIFAMPIN 300 MG PO CAPS
300.0000 mg | ORAL_CAPSULE | Freq: Two times a day (BID) | ORAL | Status: DC
  Administered 2012-07-02 – 2012-07-06 (×9): 300 mg via ORAL
  Filled 2012-07-02 (×11): qty 1

## 2012-07-02 MED ORDER — GUAIFENESIN-DM 100-10 MG/5ML PO SYRP
5.0000 mL | ORAL_SOLUTION | ORAL | Status: DC | PRN
  Filled 2012-07-02: qty 5

## 2012-07-02 MED ORDER — LATANOPROST 0.005 % OP SOLN
1.0000 [drp] | Freq: Every day | OPHTHALMIC | Status: DC
  Administered 2012-07-02 – 2012-07-05 (×4): 1 [drp] via OPHTHALMIC
  Filled 2012-07-02 (×2): qty 2.5

## 2012-07-02 NOTE — ED Notes (Signed)
Per EMS pt was recently  Dx wit bronchitis and was placed on Z pack but  Wake family up this morning with SOB,. When EMS arrived pt sat was 70% and she placed on high flow oxygen  And neb tx was given,1mg  Atrovent and 10 mg of albuterol. EMS gave her 125 mg of solu medrol and placed 22ga to LFA ,

## 2012-07-02 NOTE — ED Notes (Signed)
Attempt x 1 to call report

## 2012-07-02 NOTE — Progress Notes (Signed)
Explained movement to patient and patients family in room

## 2012-07-02 NOTE — ED Provider Notes (Addendum)
History     CSN: [id number]  Arrival date & time 07/02/12  0335   First MD Initiated Contact with Patient 07/02/12 0355      No chief complaint on file.   (Consider location/radiation/quality/duration/timing/severity/associated sxs/prior treatment) HPIJennie E Duncan is a 79 y.o. female with a history of COPD and recently diagnosed with bronchitis and placed on a Z-Pak. Patient woke her family up this morning with shortness of breath. On EMS arrival, patient was saturating 70% and responded well to nebulized bronchodilators and received Solu-Medrol. Patient denies any chest pain at this time, she is just short of breath.  No fevers, no chills, she has been coughing up more sputum recently. No abdominal pain, no vomiting or nausea, no diarrhea. No history of blood clots in the legs or lungs.  Patient is DO NOT RESUSCITATE and does not want to be intubated because she does not want to be on a ventilator.  Past Medical History  Diagnosis Date  . Arthritis   . Osteoporosis   . Hypertension   . Paresthesia   . 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 Shortness of breath     with activity  . Asthma   . GERD (gastroesophageal reflux disease)   . COPD (chronic obstructive pulmonary disease)     sees Dr. Loraine Leriche Donor  . Emphysema     hx of  . Ulcerative colitis     hx of  . Neuromuscular disorder     hx of parestesia    Past Surgical History  Procedure Date  . Other surgical history     14 surgeries to r hip/femur (post fall with fx and chronic infections)  . Incision and drainage hip 08/09/2011    Procedure: IRRIGATION AND DEBRIDEMENT HIP;  Surgeon: Kathryne Hitch, MD;  Location: [facility] OR;  Service: Orthopedics;  Laterality: Right;  Irrigation and debridement right hip  . Joint replacement     r thr 2006. 14 surgeries on hip  . Incision and drainage hip 09/13/2011    Procedure: IRRIGATION AND DEBRIDEMENT HIP;  Surgeon: Kathryne Hitch, MD;   Location: [facility] OR;  Service: Orthopedics;  Laterality: Right;  Right hip irrigation and debridement  . Incision and drainage hip 11/19/2011    Procedure: IRRIGATION AND DEBRIDEMENT HIP;  Surgeon: Kathryne Hitch, MD;  Location: [facility] OR;  Service: Orthopedics;  Laterality: Right;  Irrigation and Debridement Right Hip Joint    No family history on file.  History  Substance Use Topics  . Smoking status: Former Smoker -- 60 years    Quit date: 07/04/2001  . Smokeless tobacco: Never Used  . Alcohol Use: No    OB History    Grav Para Term Preterm Abortions TAB SAB Ect Mult Living                  Review of Systems At least 10pt or greater review of systems completed and are negative except where specified in the HPI.  Allergies  Vancomycin  Home Medications   Current Outpatient Rx  Name  Route  Sig  Dispense  Refill  . ACETAMINOPHEN 500 MG PO TABS   Oral   Take 500 mg by mouth every 6 (six) hours as needed.         . ALBUTEROL SULFATE HFA 108 (90 BASE) MCG/ACT IN AERS   Inhalation   Inhale 2 puffs into the lungs every 6 (six) hours as needed. For wheezing         .  ALBUTEROL SULFATE (2.5 MG/3ML) 0.083% IN NEBU   Nebulization   Take 2.5 mg by nebulization every 6 (six) hours as needed. For wheezing         . ARFORMOTEROL TARTRATE 15 MCG/2ML IN NEBU   Nebulization   Take 15 mcg by nebulization 2 (two) times daily.           . ATORVASTATIN CALCIUM 10 MG PO TABS   Oral   Take 5 mg by mouth 3 (three) times a week. On Monday, Wednesday, and Friday.         Marland Kitchen CALCIUM CARBONATE-VITAMIN D 500-200 MG-UNIT PO TABS   Oral   Take 1 tablet by mouth daily.           . CHOLECALCIFEROL 400 UNITS PO TABS   Oral   Take 400 Units by mouth daily.         Marland Kitchen DICLOFENAC EPOLAMINE 1.3 % TD PTCH   Transdermal   Place 1 patch onto the skin daily as needed. For shoulder pain         . DICLOFENAC SODIUM 1 % TD GEL   Topical   Apply 1 application topically at bedtime as  needed. As needed for muscle pain. Shoulder.         . DORZOLAMIDE HCL 2 % OP SOLN   Both Eyes   Place 1 drop into both eyes 2 (two) times daily.         Marland Kitchen DOXYCYCLINE HYCLATE 100 MG PO TABS   Oral   Take 100 mg by mouth 2 (two) times daily.         Marland Kitchen FAMOTIDINE 20 MG PO TABS   Oral   Take 20 mg by mouth 2 (two) times daily.         . FUROSEMIDE 20 MG PO TABS   Oral   Take 10 mg by mouth daily.         Marland Kitchen HYDROCODONE-ACETAMINOPHEN 5-325 MG PO TABS   Oral   Take 1 tablet by mouth 4 (four) times daily. For pain   30 tablet   0   . IPRATROPIUM BROMIDE 0.03 % NA SOLN   Nasal   Place 2 sprays into the nose every 12 (twelve) hours.         Marland Kitchen LATANOPROST 0.005 % OP SOLN   Both Eyes   Place 1 drop into both eyes at bedtime.          Marland Kitchen LIDOCAINE 5 % EX PTCH   Transdermal   Place 1 patch onto the skin daily. Remove & Discard patch within 12 hours or as directed by MD          . MAGNESIUM OXIDE 400 MG PO TABS   Oral   Take 400 mg by mouth daily.         . MELOXICAM 7.5 MG PO TABS   Oral   Take 7.5 mg by mouth 2 (two) times daily.         Marland Kitchen MESALAMINE 400 MG PO TBEC   Oral   Take 800 mg by mouth 3 (three) times daily.          Marland Kitchen METOPROLOL TARTRATE 25 MG PO TABS   Oral   Take 25 mg by mouth 2 (two) times daily.         Marland Kitchen PANTOPRAZOLE SODIUM 40 MG PO TBEC   Oral   Take 40 mg by mouth daily.         Marland Kitchen  PREDNISONE 10 MG PO TABS   Oral   Take 40 mg by mouth daily.         Marland Kitchen ALIGN 4 MG PO CAPS   Oral   Take 1 capsule by mouth daily.         Marland Kitchen RIFAMPIN 300 MG PO CAPS   Oral   Take 300 mg by mouth 2 (two) times daily.         . SUCRALFATE 1 G PO TABS   Oral   Take 1 g by mouth 4 (four) times daily.         Marland Kitchen TIOTROPIUM BROMIDE MONOHYDRATE 18 MCG IN CAPS   Inhalation   Place 18 mcg into inhaler and inhale daily.         Marland Kitchen VITAMIN C 500 MG PO TABS   Oral   Take 500 mg by mouth daily.         Marland Kitchen ZOLPIDEM TARTRATE 5 MG PO  TABS   Oral   Take 5 mg by mouth at bedtime.           BP 137/78  Pulse 116  Temp 98.7 F (37.1 C) (Oral)  Resp 23  SpO2 95%  Physical Exam  Nursing notes reviewed.  Electronic medical record reviewed. VITAL SIGNS:   Filed Vitals:   07/02/12 0540 07/02/12 0600 07/02/12 0700 07/02/12 0758  BP:  135/78 130/56 130/56  Pulse:  120 106 115  Temp:    98.4 F (36.9 C)  TempSrc:    Oral  Resp:  25 24 22   SpO2: 95% 100% 99% 96%   CONSTITUTIONAL: Awake, oriented, appears non-toxic HENT: Atraumatic, normocephalic, oral mucosa pink and moist, airway patent. Nares patent without drainage. External ears normal. EYES: Conjunctiva clear, EOMI, PERRLA NECK: Trachea midline, non-tender, supple CARDIOVASCULAR: Tachycardic, Normal rhythm, No murmurs, rubs, gallops PULMONARY/CHEST: Decreased breath sounds bilaterally, wheezing throughout, poor to fair air movement. Symmetrical breath sounds. Non-tender. Kyphoscoliosis. ABDOMINAL: Non-distended, soft, non-tender - no rebound or guarding.  BS normal. NEUROLOGIC: Non-focal, moving all four extremities, no gross sensory or motor deficits. EXTREMITIES: No clubbing, cyanosis, or edema SKIN: Warm, Dry, No erythema, No rash  ED Course  CRITICAL CARE Performed by: Jones Skene Authorized by: Jones Skene Total critical care time: 30 minutes Critical care was necessary to treat or prevent imminent or life-threatening deterioration of the following conditions: respiratory failure. Critical care was time spent personally by me on the following activities: re-evaluation of patient's condition, review of old charts, pulse oximetry, ordering and review of laboratory studies, obtaining history from patient or surrogate, evaluation of patient's response to treatment, discussions with consultants, development of treatment plan with patient or surrogate, examination of patient, ordering and performing treatments and interventions and ordering and review  of radiographic studies.   (including critical care time)  Date: 07/02/2012  Rate: 112  Rhythm: Sinus tachycardia  QRS Axis: normal  Intervals: normal  ST/T Wave abnormalities: normal  Conduction Disutrbances: none  Narrative Interpretation: unremarkable     Labs Reviewed  CBC WITH DIFFERENTIAL - Abnormal; Notable for the following:    Hemoglobin 11.6 (*)     MCHC 28.2 (*)     Neutro Abs 7.9 (*)     All other components within normal limits  COMPREHENSIVE METABOLIC PANEL - Abnormal; Notable for the following:    CO2 39 (*)     Glucose, Bld 135 (*)     BUN 26 (*)     Albumin 2.8 (*)  AST 63 (*)     ALT 46 (*)     Alkaline Phosphatase 35 (*)     GFR calc non Af Amer 49 (*)     GFR calc Af Amer 56 (*)     All other components within normal limits  TROPONIN I   Dg Chest Portable 1 View  07/02/2012  *RADIOLOGY REPORT*  Clinical Data: Shortness of breath.  Increased sputum production.  PORTABLE CHEST - 1 VIEW  Comparison: 04/17/2012  Findings: The shallow inspiration.  Borderline heart size with normal pulmonary vascularity.  There is increased density in the right paratracheal region which is stable since the previous study. Calcified and tortuous aorta.  Interval development of slight fibrosis or more likely linear atelectasis in the left lung base. Otherwise, there is no significant change.  IMPRESSION: Stable appearance of right paratracheal soft tissues.  Interval development of atelectasis or infiltration in the left lung base with shallow inspiration.   Original Report Authenticated By: Burman Nieves, M.D.      1. COPD exacerbation   2. Chronic CHF   3. Chronic respiratory failure   4. Infected prosthesis of right hip       MDM  Bridget Duncan is a 79 y.o. female presenting with acute on chronic respiratory failure and copd exacerbation. No pneumonia seen.  Pt responded well to nebulized albuterol, steroids and Mg. Doubt ACS in this context.  Do not think she  has a PE with wheezing and positive response to medical intervention.  Admitted to [facility] service.    Jones Skene, MD 07/03/12 4696  Jones Skene, MD 07/03/12 1707

## 2012-07-02 NOTE — Progress Notes (Signed)
Patient's ABG results called to Dr. Arthor Captain, pH 7.38, pCO2-68.8, pO2-98.4, Bicarb- 40.2.  Patient being transferred to stepdown for Bipap prn per MD.  Update given to [name] RN.  [name]

## 2012-07-02 NOTE — Progress Notes (Signed)
Dr. Arthor Captain called and reported the sat of 86% on 4l, order to move pt up to 5l and move to stepdown for now I also called RT for breathing tx

## 2012-07-02 NOTE — ED Notes (Signed)
Pt is resting, is hard of hearing. Pt is alert and oriented. Pt breath sounds are wet, loose. Skin warm and dry. Denies any pain. Pt provided apple juice. Family at bedside, waiting to move upstairs to admission bed.

## 2012-07-02 NOTE — Progress Notes (Signed)
Pt was removed from bipap at 2200 due to intolerance.  RN gave drugs to relax pt so she could tolerate. Place back on at this time/

## 2012-07-02 NOTE — H&P (Signed)
[facility] History and Physical  Bridget Duncan:[id number] DOB: 03/08/1926 DOA: 07/02/2012  Referring physician: Jones Skene, MD PCP: Bridget Dubonnet, MD   Chief Complaint: Shortness of breath   HPI: Bridget Duncan is a 79 y.o. female with past medical history of chronic respiratory failure she is on 3.5 L of oxygen, patient has chronic right hip infection she is on chronic doxycycline and rifampin. Patient started to have shortness of breath, wheezing and cough about 5 days ago, her symptoms continued to worsen. Her family has azithromycin at home so they started out on Thursday night, patient woke up early morning today and she told them she couldn't breathe. Patient brought to the emergency department, she was initially placed on nonrebreather mask, but still when she was weaned down to 4 L of oxygen. Upon initial evaluation in the emergency department x-ray showed atelectasis versus infiltrates. Patient admitted to the hospital for further evaluation.   Review of Systems:  Constitutional: negative for anorexia, fevers and sweats Eyes: negative for irritation, redness and visual disturbance Ears, nose, mouth, throat, and face: negative for earaches, epistaxis, nasal congestion and sore throat Respiratory: Per HPI Cardiovascular: negative for chest pain, dyspnea, lower extremity edema, orthopnea, palpitations and syncope Gastrointestinal: negative for abdominal pain, constipation, diarrhea, melena, nausea and vomiting Genitourinary:negative for dysuria, frequency and hematuria Hematologic/lymphatic: negative for bleeding, easy bruising and lymphadenopathy Musculoskeletal:negative for arthralgias, muscle weakness and stiff joints Neurological: negative for coordination problems, gait problems, headaches and weakness Endocrine: negative for diabetic symptoms including polydipsia, polyuria and weight loss Allergic/Immunologic: negative for anaphylaxis, hay fever and  urticaria   Past Medical History  Diagnosis Date  . Arthritis   . Osteoporosis   . Hypertension   . Paresthesia   . 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 Shortness of breath     with activity  . Asthma   . GERD (gastroesophageal reflux disease)   . COPD (chronic obstructive pulmonary disease)     sees Dr. Loraine Leriche Donor  . Emphysema     hx of  . Ulcerative colitis     hx of  . Neuromuscular disorder     hx of parestesia   Past Surgical History  Procedure Date  . Other surgical history     14 surgeries to r hip/femur (post fall with fx and chronic infections)  . Incision and drainage hip 08/09/2011    Procedure: IRRIGATION AND DEBRIDEMENT HIP;  Surgeon: Kathryne Hitch, MD;  Location: [facility] OR;  Service: Orthopedics;  Laterality: Right;  Irrigation and debridement right hip  . Joint replacement     r thr 2006. 14 surgeries on hip  . Incision and drainage hip 09/13/2011    Procedure: IRRIGATION AND DEBRIDEMENT HIP;  Surgeon: Kathryne Hitch, MD;  Location: [facility] OR;  Service: Orthopedics;  Laterality: Right;  Right hip irrigation and debridement  . Incision and drainage hip 11/19/2011    Procedure: IRRIGATION AND DEBRIDEMENT HIP;  Surgeon: Kathryne Hitch, MD;  Location: [facility] OR;  Service: Orthopedics;  Laterality: Right;  Irrigation and Debridement Right Hip Joint   Social History:  reports that she quit smoking about 11 years ago. She has never used smokeless tobacco. She reports that she does not drink alcohol or use illicit drugs.  Allergies  Allergen Reactions  . Vancomycin Hives    Patient is able to take per family-given with benadryl     Family History  Problem Relation Age of Onset  .  Diabetes Father   . Heart failure Father     Prior to Admission medications   Medication Sig Start Date End Date Taking? Authorizing Provider  acetaminophen (TYLENOL) 500 MG tablet Take 500 mg by mouth every 6 (six) hours as needed.   Yes Historical  Provider, MD  albuterol (PROVENTIL HFA;VENTOLIN HFA) 108 (90 BASE) MCG/ACT inhaler Inhale 2 puffs into the lungs every 6 (six) hours as needed. For wheezing   Yes Historical Provider, MD  albuterol (PROVENTIL) (2.5 MG/3ML) 0.083% nebulizer solution Take 2.5 mg by nebulization every 6 (six) hours as needed. For wheezing   Yes Historical Provider, MD  arformoterol (BROVANA) 15 MCG/2ML NEBU Take 15 mcg by nebulization 2 (two) times daily.     Yes Historical Provider, MD  atorvastatin (LIPITOR) 10 MG tablet Take 5 mg by mouth 3 (three) times a week. On Monday, Wednesday, and Friday.   Yes Historical Provider, MD  calcium-vitamin D (OSCAL WITH D) 500-200 MG-UNIT per tablet Take 1 tablet by mouth daily.     Yes Historical Provider, MD  cholecalciferol (VITAMIN D) 400 UNITS TABS Take 400 Units by mouth daily.   Yes Historical Provider, MD  diclofenac (FLECTOR) 1.3 % PTCH Place 1 patch onto the skin daily as needed. For shoulder pain   Yes Historical Provider, MD  diclofenac sodium (VOLTAREN) 1 % GEL Apply 1 application topically at bedtime as needed. As needed for muscle pain. Shoulder.   Yes Historical Provider, MD  dorzolamide (TRUSOPT) 2 % ophthalmic solution Place 1 drop into both eyes 2 (two) times daily.   Yes Historical Provider, MD  doxycycline (VIBRA-TABS) 100 MG tablet Take 100 mg by mouth 2 (two) times daily.   Yes Historical Provider, MD  famotidine (PEPCID) 20 MG tablet Take 20 mg by mouth 2 (two) times daily.   Yes Historical Provider, MD  furosemide (LASIX) 20 MG tablet Take 10 mg by mouth daily.   Yes Historical Provider, MD  HYDROcodone-acetaminophen (NORCO) 5-325 MG per tablet Take 1 tablet by mouth 4 (four) times daily. For pain 11/24/11  Yes Kathryne Hitch, MD  ipratropium (ATROVENT) 0.03 % nasal spray Place 2 sprays into the nose every 12 (twelve) hours.   Yes Historical Provider, MD  latanoprost (XALATAN) 0.005 % ophthalmic solution Place 1 drop into both eyes at bedtime.    Yes  Historical Provider, MD  lidocaine (LIDODERM) 5 % Place 1 patch onto the skin daily. Remove & Discard patch within 12 hours or as directed by MD    Yes Historical Provider, MD  magnesium oxide (MAG-OX) 400 MG tablet Take 400 mg by mouth daily.   Yes Historical Provider, MD  meloxicam (MOBIC) 7.5 MG tablet Take 7.5 mg by mouth 2 (two) times daily.   Yes Historical Provider, MD  mesalamine (ASACOL) 400 MG EC tablet Take 800 mg by mouth 3 (three) times daily.    Yes Historical Provider, MD  metoprolol tartrate (LOPRESSOR) 25 MG tablet Take 25 mg by mouth 2 (two) times daily.   Yes Historical Provider, MD  pantoprazole (PROTONIX) 40 MG tablet Take 40 mg by mouth daily.   Yes Historical Provider, MD  predniSONE (DELTASONE) 10 MG tablet Take 40 mg by mouth daily.   Yes Historical Provider, MD  Probiotic Product (ALIGN) 4 MG CAPS Take 1 capsule by mouth daily.   Yes Historical Provider, MD  rifampin (RIFADIN) 300 MG capsule Take 300 mg by mouth 2 (two) times daily.   Yes Historical Provider, MD  sucralfate (  CARAFATE) 1 G tablet Take 1 g by mouth 4 (four) times daily.   Yes Historical Provider, MD  tiotropium (SPIRIVA) 18 MCG inhalation capsule Place 18 mcg into inhaler and inhale daily.   Yes Historical Provider, MD  vitamin C (ASCORBIC ACID) 500 MG tablet Take 500 mg by mouth daily.   Yes Historical Provider, MD  zolpidem (AMBIEN) 5 MG tablet Take 5 mg by mouth at bedtime.   Yes Historical Provider, MD   Physical Exam: Filed Vitals:   07/02/12 0540 07/02/12 0600 07/02/12 0700 07/02/12 0758  BP:  135/78 130/56 130/56  Pulse:  120 106 115  Temp:    98.4 F (36.9 C)  TempSrc:    Oral  Resp:  25 24 22   SpO2: 95% 100% 99% 96%   General appearance: alert, cooperative and no distress  Head: Normocephalic, without obvious abnormality, atraumatic  Eyes: conjunctivae/corneas clear. PERRL, EOM's intact. Fundi benign.  Nose: Nares normal. Septum midline. Mucosa normal. No drainage or sinus tenderness.    Throat: lips, mucosa, and tongue normal; teeth and gums normal  Neck: Supple, no masses, no cervical lymphadenopathy, no JVD appreciated, no meningeal signs Resp: Bilateral rales and wheezing Chest wall: no tenderness  Cardio: regular rate and rhythm, S1, S2 normal, no murmur, click, rub or gallop  GI: soft, non-tender; bowel sounds normal; no masses, no organomegaly  Extremities: +1 pedal edema bilaterally. Skin: Skin color, texture, turgor normal. No rashes or lesions  Neurologic: Alert and oriented X 3, normal strength and tone. Normal symmetric reflexes. Normal coordination and gait   Labs on Admission:  Basic Metabolic Panel:  Lab 07/02/12 0981  NA 144  K 3.7  CL 100  CO2 39*  GLUCOSE 135*  BUN 26*  CREATININE 1.03  CALCIUM 9.7  MG --  PHOS --   Liver Function Tests:  Lab 07/02/12 0400  AST 63*  ALT 46*  ALKPHOS 35*  BILITOT 0.3  PROT 6.9  ALBUMIN 2.8*   No results found for this basename: LIPASE:5,AMYLASE:5 in the last 168 hours No results found for this basename: AMMONIA:5 in the last 168 hours CBC:  Lab 07/02/12 0400  WBC 10.5  NEUTROABS 7.9*  HGB 11.6*  HCT 41.1  MCV 96.3  PLT 328   Cardiac Enzymes:  Lab 07/02/12 0404  CKTOTAL --  CKMB --  CKMBINDEX --  TROPONINI <0.30    BNP (last 3 results) No results found for this basename: PROBNP:3 in the last 8760 hours CBG: No results found for this basename: GLUCAP:5 in the last 168 hours  Radiological Exams on Admission: Dg Chest Portable 1 View  07/02/2012  *RADIOLOGY REPORT*  Clinical Data: Shortness of breath.  Increased sputum production.  PORTABLE CHEST - 1 VIEW  Comparison: 04/17/2012  Findings: The shallow inspiration.  Borderline heart size with normal pulmonary vascularity.  There is increased density in the right paratracheal region which is stable since the previous study. Calcified and tortuous aorta.  Interval development of slight fibrosis or more likely linear atelectasis in the left  lung base. Otherwise, there is no significant change.  IMPRESSION: Stable appearance of right paratracheal soft tissues.  Interval development of atelectasis or infiltration in the left lung base with shallow inspiration.   Original Report Authenticated By: Burman Nieves, M.D.     EKG: Independently reviewed.   Assessment/Plan Principal Problem:  *COPD exacerbation Active Problems:  Infected prosthesis of right hip  Chronic respiratory failure  UC (ulcerative colitis)  Chronic CHF  COPD exacerbation -Patient has COPD came in with shortness of breath, wheezing and cough. -Chest x-ray showed questionable infiltrates versus atelectasis. -Started on antibiotics, systemic steroids, inhaled bronchodilators, antitussives, mucolytics and oxygen. -Wean off the steroids depending on the severity of the wheezing.  Chronic respiratory failure -Secondary to COPD, patient of 3.5 L of oxygen at home. Initially she needed nonrebreather mask. -She is down to 4 L of oxygen now, I gave Lasix as she has some signs of fluid overload. -Continue bronchodilators and oxygen  Chronic CHF -Remote 2-D echo in June of 2010 showed diastolic dysfunction and normal LVEF. -She is a Lasix, I increased her Lasix dose for today, follow renal function a.m.  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 -She is on doxycycline and rifampin, these are continued without change.  Ulcerative colitis -Patient is on Asacol and 30 mg of oral prednisone daily. -Prednisone will be on hold as patient will be on high-dose of Solu-Medrol.  Dr. Roselind Messier notified about the admission, he will take over in AM.  Code Status: DNR/DNI Family Communication: Daughter and son-in-law was at bedside Disposition Plan: Inpatient  Time spent: 70 minutes  [facility] Pager [phone]  If 7PM-7AM, please contact night-coverage [email or web address] Password  [facility] 07/02/2012, 9:46 AM

## 2012-07-02 NOTE — Progress Notes (Signed)
Pt is sleeping but easily aroused smiling at me when I wake her up "I just tired 02 at 4l sat 86 % now ABG ordered by MD  No respiratory distress noted

## 2012-07-03 LAB — CBC
HCT: 35.5 % — ABNORMAL LOW (ref 36.0–46.0)
MCH: 27.6 pg (ref 26.0–34.0)
MCV: 94.2 fL (ref 78.0–100.0)
Platelets: 279 10*3/uL (ref 150–400)
RBC: 3.77 MIL/uL — ABNORMAL LOW (ref 3.87–5.11)
WBC: 7.1 10*3/uL (ref 4.0–10.5)

## 2012-07-03 LAB — URINALYSIS, ROUTINE W REFLEX MICROSCOPIC
Bilirubin Urine: NEGATIVE
Hgb urine dipstick: NEGATIVE
Ketones, ur: NEGATIVE mg/dL
Nitrite: NEGATIVE
Urobilinogen, UA: 0.2 mg/dL (ref 0.0–1.0)

## 2012-07-03 LAB — BASIC METABOLIC PANEL
CO2: 42 mEq/L (ref 19–32)
Calcium: 9.1 mg/dL (ref 8.4–10.5)
Chloride: 98 mEq/L (ref 96–112)
Glucose, Bld: 112 mg/dL — ABNORMAL HIGH (ref 70–99)
Sodium: 144 mEq/L (ref 135–145)

## 2012-07-03 LAB — URINE MICROSCOPIC-ADD ON

## 2012-07-03 MED ORDER — ACETAZOLAMIDE 250 MG PO TABS
250.0000 mg | ORAL_TABLET | Freq: Two times a day (BID) | ORAL | Status: DC
  Administered 2012-07-03 – 2012-07-06 (×7): 250 mg via ORAL
  Filled 2012-07-03 (×8): qty 1

## 2012-07-03 NOTE — Clinical Documentation Improvement (Signed)
GENERIC DOCUMENTATION CLARIFICATION QUERY  THIS DOCUMENT IS NOT A PERMANENT PART OF THE MEDICAL RECORD  TO RESPOND TO THE THIS QUERY, FOLLOW THE INSTRUCTIONS BELOW:  1. If needed, update documentation for the patient's encounter via the notes activity.  2. Access this query again and click edit on the In [employer].  3. After updating, or not, click F2 to complete all highlighted (required) fields concerning your review. Select "additional documentation in the medical record" OR "no additional documentation provided".  4. Click Sign note button.  5. The deficiency will fall out of your In Basket *Please let us know if you are not able to complete this workflow by phone or e-mail (listed below).  Please update your documentation within the medical record to reflect your response to this query.                                                                                        07/03/12   Dear Dr. Kevan Ny  / Associates,  In a better effort to capture your patient's severity of illness/SOI, risk of mortality/ROM, reflect appropriate length of stay and utilization of resources, a review of the patient medical record has revealed the following indicators.   NOTED IN ED MD NOTES "ACUTE ON CHRONIC RESPIRATORY FAILURE". IF YOU AGREE PLEASE PLACE IN NOTES/DC SUMMARY TO CLARIFY PATIENT'S SEVERITY OF ILLNESS / RISK OF MORTALITY. THANK YOU.  Possible Clinical Conditions? - Acute on Chronic Respiratory Failure - Other Condition (please specify)   Supporting Information: - Risk Factors: Admitted with COPD exacerbation, hx Chronic CHF Diastolic dysfunction, Chronic Respiratory Failure on Home O2 3.5L - Signs & Symptoms: "Decreased breath sounds bilaterally, wheezing throughout, poor to fair air movement", [id number], R-25-23 - Diagnostics: CXR: Atelectasis vs Infiltrates - Treatment: Nebulized Albuterol, Steroids, Magnesium, Lasix changed to Diamox, NRB mask on BiPap to CPap   You may use  possible, probable, or suspect with inpatient documentation. possible, probable, suspected diagnoses MUST be documented at the time of discharge  Reviewed: additional documentation in the medical record  Thank You,  Beverley Fiedler RN BSN Clinical Documentation Specialist: Tele:  [phone] Health Information Management [place]

## 2012-07-03 NOTE — Progress Notes (Signed)
Pt doing well off of bipap at this time.  Pt on a 4lpm [place] and saturations are 94-97%.

## 2012-07-03 NOTE — Progress Notes (Signed)
Subjective: Patient alert, on CPAP.  Feels very thirsty.  No complaint of pain.  No respiratory distress on CPAP  Objective: Weight change:   Intake/Output Summary (Last 24 hours) at 07/03/12 0748 Last data filed at 07/02/12 1030  Gross per 24 hour  Intake    240 ml  Output      0 ml  Net    240 ml   Filed Vitals:   07/03/12 0341 07/03/12 0400 07/03/12 0500 07/03/12 0600  BP: 130/66 132/64 129/65 117/67  Pulse: 75 67 66 69  Temp: 98.1 F (36.7 C)     TempSrc: Axillary     Resp: 25 23 22 25   SpO2: 97% 95% 97% 96%    General Appearance: Alert, cooperative, no distress, appears stated age Head: Normocephalic, without obvious abnormality, atraumatic Neck: Supple, symmetrical Lungs: Distant breath sounds, no current wheezes Heart: Regular rate and rhythm, S1 and S2 normal, no murmur, rub or gallop Abdomen: Soft, non-tender, bowel sounds active all four quadrants, no masses, no organomegaly Extremities: 1+ edema bilaterally, chronic wound right hip Pulses: 2+ and symmetric all extremities Skin: Skin color, texture, turgor normal, no rashes or lesions Neuro: CNII-XII intact. Normal strength, sensation and reflexes throughout   Lab Results:  Basename 07/03/12 0500 07/02/12 0400  NA 144 144  K 3.7 3.7  CL 98 100  CO2 42* 39*  GLUCOSE 112* 135*  BUN 24* 26*  CREATININE 0.85 1.03  CALCIUM 9.1 9.7  MG -- --  PHOS -- --    Basename 07/02/12 0400  AST 63*  ALT 46*  ALKPHOS 35*  BILITOT 0.3  PROT 6.9  ALBUMIN 2.8*   No results found for this basename: LIPASE:2,AMYLASE:2 in the last 72 hours  Basename 07/03/12 0500 07/02/12 0400  WBC 7.1 10.5  NEUTROABS -- 7.9*  HGB 10.4* 11.6*  HCT 35.5* 41.1  MCV 94.2 96.3  PLT 279 328    Basename 07/02/12 0404  CKTOTAL --  CKMB --  CKMBINDEX --  TROPONINI <0.30   No components found with this basename: POCBNP:3 No results found for this basename: DDIMER:2 in the last 72 hours No results found for this basename:  HGBA1C:2 in the last 72 hours No results found for this basename: CHOL:2,HDL:2,LDLCALC:2,TRIG:2,CHOLHDL:2,LDLDIRECT:2 in the last 72 hours  Basename 07/02/12 0925  TSH 0.925  T4TOTAL --  T3FREE --  THYROIDAB --   No results found for this basename: VITAMINB12:2,FOLATE:2,FERRITIN:2,TIBC:2,IRON:2,RETICCTPCT:2 in the last 72 hours  Studies/Results: Dg Chest Portable 1 View  07/02/2012  *RADIOLOGY REPORT*  Clinical Data: Shortness of breath.  Increased sputum production.  PORTABLE CHEST - 1 VIEW  Comparison: 04/17/2012  Findings: The shallow inspiration.  Borderline heart size with normal pulmonary vascularity.  There is increased density in the right paratracheal region which is stable since the previous study. Calcified and tortuous aorta.  Interval development of slight fibrosis or more likely linear atelectasis in the left lung base. Otherwise, there is no significant change.  IMPRESSION: Stable appearance of right paratracheal soft tissues.  Interval development of atelectasis or infiltration in the left lung base with shallow inspiration.   Original Report Authenticated By: Burman Nieves, M.D.    Medications: Scheduled Meds:   . acetaZOLAMIDE  250 mg Oral BID  . albuterol  2.5 mg Nebulization TID  . arformoterol  15 mcg Nebulization BID  . atorvastatin  5 mg Oral Q M,W,F-1800  . calcium-vitamin D  1 tablet Oral Daily  . cholecalciferol  400 Units Oral Daily  .  dorzolamide  1 drop Both Eyes BID  . doxycycline  100 mg Oral BID  . guaiFENesin  1,200 mg Oral BID  . heparin  5,000 Units Subcutaneous Q8H  . HYDROcodone-acetaminophen  1 tablet Oral QID  . ipratropium  0.5 mg Nebulization TID  . latanoprost  1 drop Both Eyes QHS  . lidocaine  1 patch Transdermal Q24H  . magnesium oxide  400 mg Oral Daily  . Mesalamine  800 mg Oral TID  . methylPREDNISolone (SOLU-MEDROL) injection  80 mg Intravenous Q8H  . metoprolol tartrate  25 mg Oral BID  . pantoprazole  40 mg Oral Daily  .  rifampin  300 mg Oral BID  . sodium chloride  3 mL Intravenous Q12H  . sucralfate  1 g Oral TID AC & HS  . vitamin C  500 mg Oral Daily  . zolpidem  5 mg Oral QHS   Continuous Infusions:  PRN Meds:.acetaminophen, acetaminophen, albuterol, diclofenac, guaiFENesin-dextromethorphan, morphine injection, ondansetron (ZOFRAN) IV, ondansetron  Assessment/Plan:  Principal Problem:  *COPD exacerbation  Active Problems:  Infected prosthesis of right hip  Chronic respiratory failure  UC (ulcerative colitis)  Chronic CHF   COPD exacerbation  -Patient has COPD came in with shortness of breath, wheezing and cough.  -Chest x-ray showed questionable infiltrate left base.  White blood cell count better today on antibiotic therapy -Started on antibiotics, systemic steroids, inhaled bronchodilators, antitussives, mucolytics and oxygen.  CPAP started last night -Wean off the steroids depending on the severity of the wheezing.  Taper Solu-Medrol quickly Chronic respiratory failure  -Secondary to COPD, patient of 3.5 L of oxygen at home. Initially she needed nonrebreather mask.  -She is on CPAP now.  Taper of per respiratory  -Continue bronchodilators and oxygen.  Start Diamox for metabolic alkalosis  Chronic CHF  -Remote 2-D echo in June of 2010 showed diastolic dysfunction and normal LVEF.  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  -She is on doxycycline and rifampin, these are continued without change.  Ulcerative colitis  -Patient is on Asacol and 30 mg of oral prednisone daily.  -Prednisone will be on hold as patient will be on high-dose of Solu-Medrol.    Code Status: DNR/DNI  Family Communication: Daughter and son-in-law was at bedside  Disposition Plan: Inpatient   LOS: 1 day   Bridget Duncan 07/03/2012, 7:48 AM

## 2012-07-03 NOTE — Progress Notes (Signed)
Utilization Review Completed.  07/03/2012  

## 2012-07-03 NOTE — Progress Notes (Signed)
CRITICAL VALUE ALERT  Critical value received: CO2 42  Date of notification:  07/03/12  Time of notification:  0615  Critical value read back:yes  Nurse who received alert:  M.Martin, RN  MD notified (1st page):  Dr. Nehemiah Settle  Time of first page:  [phone]  MD notified (2nd page):  Time of second page:  Responding MD:  Dr. Nehemiah Settle  Time MD responded:  [phone]

## 2012-07-04 DIAGNOSIS — R918 Other nonspecific abnormal finding of lung field: Secondary | ICD-10-CM | POA: Diagnosis present

## 2012-07-04 LAB — CBC WITH DIFFERENTIAL/PLATELET
Basophils Absolute: 0 10*3/uL (ref 0.0–0.1)
Basophils Relative: 0 % (ref 0–1)
Eosinophils Absolute: 0 10*3/uL (ref 0.0–0.7)
Eosinophils Relative: 0 % (ref 0–5)
HCT: 34.4 % — ABNORMAL LOW (ref 36.0–46.0)
Lymphocytes Relative: 21 % (ref 12–46)
MCH: 27.6 pg (ref 26.0–34.0)
MCHC: 29.1 g/dL — ABNORMAL LOW (ref 30.0–36.0)
MCV: 95 fL (ref 78.0–100.0)
Monocytes Absolute: 0.5 10*3/uL (ref 0.1–1.0)
Platelets: 254 10*3/uL (ref 150–400)
RDW: 15.7 % — ABNORMAL HIGH (ref 11.5–15.5)

## 2012-07-04 LAB — BASIC METABOLIC PANEL
BUN: 24 mg/dL — ABNORMAL HIGH (ref 6–23)
CO2: 36 mEq/L — ABNORMAL HIGH (ref 19–32)
Calcium: 9.3 mg/dL (ref 8.4–10.5)
Chloride: 98 mEq/L (ref 96–112)
Creatinine, Ser: 0.82 mg/dL (ref 0.50–1.10)
Glucose, Bld: 87 mg/dL (ref 70–99)

## 2012-07-04 LAB — PRO B NATRIURETIC PEPTIDE: Pro B Natriuretic peptide (BNP): 2612 pg/mL — ABNORMAL HIGH (ref 0–450)

## 2012-07-04 MED ORDER — METHYLPREDNISOLONE SODIUM SUCC 125 MG IJ SOLR
60.0000 mg | Freq: Three times a day (TID) | INTRAMUSCULAR | Status: DC
  Administered 2012-07-04 – 2012-07-05 (×2): 60 mg via INTRAVENOUS
  Filled 2012-07-04 (×5): qty 0.96

## 2012-07-04 NOTE — Progress Notes (Signed)
Subjective: Patient had one episode of the same last night doing well now off of CPAP today. We'll continue nocturnal CPAP  Objective: Weight change:   Intake/Output Summary (Last 24 hours) at 07/04/12 1838 Last data filed at 07/04/12 1350  Gross per 24 hour  Intake    723 ml  Output      0 ml  Net    723 ml   Filed Vitals:   07/04/12 1400 07/04/12 1435 07/04/12 1600 07/04/12 1654  BP: 141/68  136/64   Pulse: 66  65   Temp:   98.2 F (36.8 C)   TempSrc:   Oral   Resp: 21  19   Weight:      SpO2:  100% 96% 89%   General Appearance: Alert, cooperative, no distress, coming off CPAP Head: Normocephalic, without obvious abnormality, atraumatic Neck: Supple, symmetrical Lungs: scattered rhonchi with cough and very faint end expiratory wheeze Heart: Regular rate and rhythm, S1 and S2 normal, no murmur, rub or gallop Abdomen: Soft, non-tender, bowel sounds active all four quadrants, no masses, no organomegaly Extremities: right hip without drainage. Wound healed Pulses: 2+ and symmetric all extremities Skin: Skin color, texture, turgor normal, no rashes or lesions Neuro: CNII-XII intact. Normal strength, sensation and reflexes throughout  Lab Results:  Basename 07/04/12 0643 07/03/12 0500  NA 140 144  K 3.5 3.7  CL 98 98  CO2 36* 42*  GLUCOSE 87 112*  BUN 24* 24*  CREATININE 0.82 0.85  CALCIUM 9.3 9.1  MG -- --  PHOS -- --    Basename 07/02/12 0400  AST 63*  ALT 46*  ALKPHOS 35*  BILITOT 0.3  PROT 6.9  ALBUMIN 2.8*   No results found for this basename: LIPASE:2,AMYLASE:2 in the last 72 hours  Basename 07/04/12 0643 07/03/12 0500 07/02/12 0400  WBC 7.5 7.1 --  NEUTROABS 5.4 -- 7.9*  HGB 10.0* 10.4* --  HCT 34.4* 35.5* --  MCV 95.0 94.2 --  PLT 254 279 --    Basename 07/02/12 0404  CKTOTAL --  CKMB --  CKMBINDEX --  TROPONINI <0.30   No components found with this basename: POCBNP:3 No results found for this basename: DDIMER:2 in the last 72 hours No  results found for this basename: HGBA1C:2 in the last 72 hours No results found for this basename: CHOL:2,HDL:2,LDLCALC:2,TRIG:2,CHOLHDL:2,LDLDIRECT:2 in the last 72 hours  Basename 07/02/12 0925  TSH 0.925  T4TOTAL --  T3FREE --  THYROIDAB --   No results found for this basename: VITAMINB12:2,FOLATE:2,FERRITIN:2,TIBC:2,IRON:2,RETICCTPCT:2 in the last 72 hours  Studies/Results: No results found. Medications: Scheduled Meds:   . acetaZOLAMIDE  250 mg Oral BID  . albuterol  2.5 mg Nebulization TID  . arformoterol  15 mcg Nebulization BID  . atorvastatin  5 mg Oral Q M,W,F-1800  . calcium-vitamin D  1 tablet Oral Daily  . cholecalciferol  400 Units Oral Daily  . dorzolamide  1 drop Both Eyes BID  . doxycycline  100 mg Oral BID  . guaiFENesin  1,200 mg Oral BID  . heparin  5,000 Units Subcutaneous Q8H  . HYDROcodone-acetaminophen  1 tablet Oral QID  . ipratropium  0.5 mg Nebulization TID  . latanoprost  1 drop Both Eyes QHS  . lidocaine  1 patch Transdermal Q24H  . magnesium oxide  400 mg Oral Daily  . Mesalamine  800 mg Oral TID  . methylPREDNISolone (SOLU-MEDROL) injection  80 mg Intravenous Q8H  . metoprolol tartrate  25 mg Oral BID  .  pantoprazole  40 mg Oral Daily  . rifampin  300 mg Oral BID  . sodium chloride  3 mL Intravenous Q12H  . sucralfate  1 g Oral TID AC & HS  . vitamin C  500 mg Oral Daily  . zolpidem  5 mg Oral QHS   Continuous Infusions:  PRN Meds:.acetaminophen, acetaminophen, albuterol, diclofenac, guaiFENesin-dextromethorphan, morphine injection, ondansetron (ZOFRAN) IV, ondansetron  Assessment/Plan: Principal Problem:  COPD exacerbation  Active Problems:  Infected prosthesis of right hip  Chronic respiratory failure  UC (ulcerative colitis)  Chronic CHF Left lower lobe infiltrate   COPD exacerbation  -Patient has COPD came in with shortness of breath, wheezing and cough.  -Chest x-ray shows questionable infiltrate left base. White blood cell  count better today on antibiotic therapy  -Started on antibiotics, systemic steroids, inhaled bronchodilators, antitussives, mucolytics and oxygen. CPAP Discontinue during the day this morning. Continue at night -Wean off the steroids depending on the severity of the wheezing. Begin to taper Solu-Medrol Chronic respiratory failure  -Secondary to COPD, patient of 3.5 L of oxygen at home. Initially she needed nonrebreather mask.  -She is off CPAP now. Taper of per respiratory - May have to use CPAP at night -Continue bronchodilators and oxygen. Continue Diamox for metabolic alkalosis  Chronic CHF  -Remote 2-D echo in June of 2010 showed diastolic dysfunction and normal LVEF.  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  -She is on doxycycline and rifampin, these are continued without change. No evidence of wound at right hip site. Well healed. Ulcerative colitis  -Patient is on Asacol and 30 mg of oral prednisone daily.  -Prednisone will be on hold as patient will be on high-dose of Solu-Medrol.   Code Status: DNR/DNI     LOS: 2 days   Adrine Hayworth NEVILL 07/04/2012, 6:38 PM

## 2012-07-05 ENCOUNTER — Inpatient Hospital Stay (HOSPITAL_COMMUNITY): Payer: Medicare Other

## 2012-07-05 LAB — BASIC METABOLIC PANEL
BUN: 21 mg/dL (ref 6–23)
Calcium: 9.2 mg/dL (ref 8.4–10.5)
Chloride: 101 mEq/L (ref 96–112)
Creatinine, Ser: 0.76 mg/dL (ref 0.50–1.10)
GFR calc Af Amer: 87 mL/min — ABNORMAL LOW (ref >90)

## 2012-07-05 MED ORDER — METHYLPREDNISOLONE SODIUM SUCC 125 MG IJ SOLR
60.0000 mg | Freq: Two times a day (BID) | INTRAMUSCULAR | Status: DC
  Administered 2012-07-05 – 2012-07-06 (×2): 60 mg via INTRAVENOUS
  Filled 2012-07-05 (×6): qty 0.96

## 2012-07-05 MED ORDER — HEPARIN SODIUM (PORCINE) 5000 UNIT/ML IJ SOLN
5000.0000 [IU] | Freq: Two times a day (BID) | INTRAMUSCULAR | Status: DC
  Administered 2012-07-05 – 2012-07-06 (×2): 5000 [IU] via SUBCUTANEOUS
  Filled 2012-07-05 (×4): qty 1

## 2012-07-05 NOTE — Progress Notes (Signed)
Dr. Kevan Ny was notified for an order to 'transfer pt to tele floor' as he intended, he said he will put the order in the computer. Will keep checking for that order so that pt can be transferred off the floor.------Adelynn Gipe, rn

## 2012-07-05 NOTE — Evaluation (Signed)
Physical Therapy Evaluation Patient Details Name: Bridget Duncan MRN: [id number] DOB: 08/06/1925 Today's Date: 07/05/2012 Time: 0865-7846 PT Time Calculation (min): 31 min  PT Assessment / Plan / Recommendation Clinical Impression  Pt admitted with COPD exacerbation and is progressing with mobility. Pt requires RW for mobility as this is her baseline. Pt will benefit from acute therapy to maximize mobility, function and transfers to safely return home with 24hr assist. Pt sats dropped o 83% on 4L with gait with HR 67-90 with activity. Pt returns to 97% on 3L at rest with HR 70.     PT Assessment  Patient needs continued PT services    Follow Up Recommendations  Home health PT;Supervision/Assistance - 24 hour    Does the patient have the potential to tolerate intense rehabilitation      Barriers to Discharge None      Equipment Recommendations  None recommended by PT    Recommendations for Other Services     Frequency Min 3X/week    Precautions / Restrictions Precautions Precautions: Fall   Pertinent Vitals/Pain No pain reported      Mobility  Bed Mobility Bed Mobility: Supine to Sit Supine to Sit: HOB elevated;4: Min assist (HOB 20degrees) Details for Bed Mobility Assistance: hand held assist to elevate trunk from surface with cueing Transfers Transfers: Sit to Stand;Stand to Sit;Stand Pivot Transfers Sit to Stand: 4: Min assist;From bed;From chair/3-in-1 Stand to Sit: 4: Min assist;To chair/3-in-1 Stand Pivot Transfers: 4: Min assist Details for Transfer Assistance: min assist to achieve anterior translation from bed and BSC. Pt pivoted to left without use of AD and min assist for stability with cueing for hand placement with all transfers Ambulation/Gait Ambulation/Gait Assistance: 4: Min guard Ambulation Distance (Feet): 35 Feet Assistive device: Rolling walker Ambulation/Gait Assistance Details: pt maintains flexed posture with cueing to recognize activity  limitations Gait Pattern: Step-through pattern;Decreased stride length;Trunk flexed Gait velocity: decreased Stairs: No    Shoulder Instructions     Exercises     PT Diagnosis: Difficulty walking  PT Problem List: Decreased activity tolerance;Decreased mobility;Cardiopulmonary status limiting activity PT Treatment Interventions: Gait training;Functional mobility training;Therapeutic activities;Therapeutic exercise;Patient/family education   PT Goals Acute Rehab PT Goals PT Goal Formulation: With patient Time For Goal Achievement: 07/19/12 Potential to Achieve Goals: Good Pt will go Supine/Side to Sit: with supervision;with HOB 0 degrees PT Goal: Supine/Side to Sit - Progress: Goal set today Pt will go Sit to Supine/Side: with supervision;with HOB 0 degrees PT Goal: Sit to Supine/Side - Progress: Goal set today Pt will go Sit to Stand: with supervision PT Goal: Sit to Stand - Progress: Goal set today Pt will go Stand to Sit: with supervision PT Goal: Stand to Sit - Progress: Goal set today Pt will Ambulate: 51 - 150 feet;with rolling walker;with supervision PT Goal: Ambulate - Progress: Goal set today  Visit Information  Last PT Received On: 07/05/12 Assistance Needed: +1    Subjective Data  Subjective: I can't get up from anything low Patient Stated Goal: return home with family   Prior Functioning  Home Living Lives With: Daughter (and son-in-law) Available Help at Discharge: Family;Personal care attendant;Available 24 hours/day Type of Home: House Home Access: Ramped entrance Home Layout: One level Bathroom Shower/Tub: Engineer, manufacturing systems: Standard Home Adaptive Equipment: Bedside commode/3-in-1;Tub transfer bench;Walker - rolling Prior Function Level of Independence: Needs assistance Needs Assistance: Bathing;Dressing;Meal Prep;Light Housekeeping Bath: Moderate Dressing: Moderate Meal Prep: Total Light Housekeeping: Total Able to Take Stairs?:  No Driving:  No Vocation: Retired Comments: Pt states she sponges bathes most of the week and gets in the shower with bench 1x/wk. Pt requires assist with ADLs due to multiple surgeris on Right hip and poor mobility of left shoulder. Pt states she can usually transfer and walk with RW with supervision.  Communication Communication: HOH (bil hearing aids)    Cognition  Overall Cognitive Status: Appears within functional limits for tasks assessed/performed Arousal/Alertness: Awake/alert Orientation Level: Appears intact for tasks assessed Behavior During Session: [facility] for tasks performed    Extremity/Trunk Assessment Left Upper Extremity Assessment LUE ROM/Strength/Tone: Deficits;Unable to fully assess LUE ROM/Strength/Tone Deficits: pt reports long history of "bad shoulder" very limited ROM in shoulder active abduction grossly 15 degrees and flexion grossly 50degrees Right Lower Extremity Assessment RLE ROM/Strength/Tone: Deficits RLE ROM/Strength/Tone Deficits: pt with multiple hip surgeries with shoe lift on right due to leg length discrepancy. Grossly 2+/5 Left Lower Extremity Assessment LLE ROM/Strength/Tone: WFL for tasks assessed (grossly 2+/5) Trunk Assessment Trunk Assessment: Kyphotic   Balance    End of Session PT - End of Session Equipment Utilized During Treatment: Gait belt;Oxygen Activity Tolerance: Patient tolerated treatment well Patient left: in chair  GP     Toney Sang [facility] 07/05/2012, 11:46 AM  Delaney Meigs, PT [phone]

## 2012-07-05 NOTE — Progress Notes (Addendum)
Called to give report to the receiving rn on [phone], monitor tech said receiving rn said she was tied up with a patient and will call back for report, phone number was provided for her to call back.------Macarena Langseth, rn

## 2012-07-05 NOTE — Progress Notes (Signed)
Subjective: Patient feeling better.  Lungs still congested but did not require CPAP last night.  Patient is comfortable.  Ready for transfer to telemetry.  Will start incentive parameter and flutter valve and slowly reduce steroids.  Seems to be tolerating Diamox well and bicarbonate is coming down slowly  Objective: Weight change:   Intake/Output Summary (Last 24 hours) at 07/05/12 0747 Last data filed at 07/04/12 1800  Gross per 24 hour  Intake    483 ml  Output    200 ml  Net    283 ml   Filed Vitals:   07/05/12 0000 07/05/12 0200 07/05/12 0400 07/05/12 0600  BP: 144/71 142/63 129/59 142/69  Pulse: 57 67 59 65  Temp:   98.7 F (37.1 C)   TempSrc:   Axillary   Resp: 19 18 20 20   Weight:      SpO2:  93% 95%    General Appearance: Alert, cooperative, no distress, off CPAP  Head: Normocephalic, without obvious abnormality, atraumatic  Neck: Supple, symmetrical  Lungs: scattered rhonchi with cough and very faint end expiratory wheeze  Heart: Regular rate and rhythm, S1 and S2 normal, no murmur, rub or gallop  Abdomen: Soft, non-tender, bowel sounds active all four quadrants, no masses, no organomegaly  Extremities: right hip without drainage. Wound healed  Pulses: 2+ and symmetric all extremities  Skin: Skin color, texture, turgor normal, no rashes or lesions  Neuro: CNII-XII intact. Normal strength, sensation and reflexes throughout   Lab Results:  Basename 07/05/12 0435 07/04/12 0643  NA 141 140  K 3.6 3.5  CL 101 98  CO2 33* 36*  GLUCOSE 85 87  BUN 21 24*  CREATININE 0.76 0.82  CALCIUM 9.2 9.3  MG -- --  PHOS -- --   No results found for this basename: AST:2,ALT:2,ALKPHOS:2,BILITOT:2,PROT:2,ALBUMIN:2 in the last 72 hours No results found for this basename: LIPASE:2,AMYLASE:2 in the last 72 hours  Basename 07/04/12 0643 07/03/12 0500  WBC 7.5 7.1  NEUTROABS 5.4 --  HGB 10.0* 10.4*  HCT 34.4* 35.5*  MCV 95.0 94.2  PLT 254 279   No results found for this  basename: CKTOTAL:3,CKMB:3,CKMBINDEX:3,TROPONINI:3 in the last 72 hours No components found with this basename: POCBNP:3 No results found for this basename: DDIMER:2 in the last 72 hours No results found for this basename: HGBA1C:2 in the last 72 hours No results found for this basename: CHOL:2,HDL:2,LDLCALC:2,TRIG:2,CHOLHDL:2,LDLDIRECT:2 in the last 72 hours  Basename 07/02/12 0925  TSH 0.925  T4TOTAL --  T3FREE --  THYROIDAB --   No results found for this basename: VITAMINB12:2,FOLATE:2,FERRITIN:2,TIBC:2,IRON:2,RETICCTPCT:2 in the last 72 hours  Studies/Results: No results found. Medications: Scheduled Meds:   . acetaZOLAMIDE  250 mg Oral BID  . albuterol  2.5 mg Nebulization TID  . arformoterol  15 mcg Nebulization BID  . atorvastatin  5 mg Oral Q M,W,F-1800  . calcium-vitamin D  1 tablet Oral Daily  . cholecalciferol  400 Units Oral Daily  . dorzolamide  1 drop Both Eyes BID  . doxycycline  100 mg Oral BID  . guaiFENesin  1,200 mg Oral BID  . heparin  5,000 Units Subcutaneous BID  . HYDROcodone-acetaminophen  1 tablet Oral QID  . ipratropium  0.5 mg Nebulization TID  . latanoprost  1 drop Both Eyes QHS  . lidocaine  1 patch Transdermal Q24H  . magnesium oxide  400 mg Oral Daily  . Mesalamine  800 mg Oral TID  . methylPREDNISolone (SOLU-MEDROL) injection  60 mg Intravenous Q12H  .  metoprolol tartrate  25 mg Oral BID  . pantoprazole  40 mg Oral Daily  . rifampin  300 mg Oral BID  . sodium chloride  3 mL Intravenous Q12H  . sucralfate  1 g Oral TID AC & HS  . vitamin C  500 mg Oral Daily  . zolpidem  5 mg Oral QHS   Continuous Infusions:  PRN Meds:.acetaminophen, acetaminophen, albuterol, guaiFENesin-dextromethorphan, ondansetron (ZOFRAN) IV, ondansetron  Assessment/Plan:  Principal Problem:  COPD exacerbation  Active Problems:  Infected prosthesis of right hip  Chronic respiratory failure  UC (ulcerative colitis)  Chronic CHF  Left lower lobe infiltrate     COPD exacerbation  -Still with congested cough and wheezing much improved  -Chest x-ray showed questionable infiltrate left base.  Will repeat chest x-ray.  Continue antibiotics, systemic steroids, inhaled bronchodilators, antitussives, mucolytics and oxygen. CPAP discontinued  -Wean off the steroids depending on the severity of the wheezing.  Continue to taper Solu-Medrol  Chronic respiratory failure  -Secondary to COPD, patient of 3.5 L of oxygen at home. Initially she needed nonrebreather mask.  -She is off CPAP now. Taper of per respiratory - transfer to telemetry -Continue bronchodilators and oxygen. Continue Diamox for metabolic alkalosis  Chronic CHF  -Remote 2-D echo in June of 2010 showed diastolic dysfunction and normal LVEF.  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  -She is on doxycycline and rifampin, these are continued without change. No evidence of wound at right hip site. Well healed.  Ulcerative colitis  -Patient is on Asacol and 30 mg of oral prednisone daily.  -Change back to prednisone soon Disposition transfer to telemetry and plan for transfer to [facility] nursing facility for strengthening prior to transfer her home  Code Status: DNR/DNI     LOS: 3 days   Bridget Duncan 07/05/2012, 7:47 AM

## 2012-07-05 NOTE — Progress Notes (Signed)
Gave report to [facility] the receiving rn on 4700.------Alexarae Oliva, rn

## 2012-07-05 NOTE — Progress Notes (Signed)
Called to give report to receiving rn on 4700, monitor tech said the nurse was tied up with a patient and will call back for report. Phone number was provided for her to call back and get report.----Aamir Mclinden, rn

## 2012-07-05 NOTE — Progress Notes (Signed)
Called again to give report on patient, rn was still busy. Will call again.------Nilah Belcourt, rn

## 2012-07-05 NOTE — Progress Notes (Signed)
Received patient from transferring unit.  Placed on oxygen, and monitor.  Alert and responsive.

## 2012-07-05 NOTE — Progress Notes (Signed)
Pt has been transferred off the floor.------Lexianna Weinrich, rn

## 2012-07-06 DIAGNOSIS — J9620 Acute and chronic respiratory failure, unspecified whether with hypoxia or hypercapnia: Secondary | ICD-10-CM | POA: Diagnosis present

## 2012-07-06 LAB — BASIC METABOLIC PANEL
BUN: 17 mg/dL (ref 6–23)
Chloride: 101 mEq/L (ref 96–112)
Creatinine, Ser: 0.71 mg/dL (ref 0.50–1.10)
GFR calc non Af Amer: 77 mL/min — ABNORMAL LOW (ref >90)
Glucose, Bld: 72 mg/dL (ref 70–99)
Potassium: 3.2 mEq/L — ABNORMAL LOW (ref 3.5–5.1)

## 2012-07-06 MED ORDER — PREDNISONE 50 MG PO TABS: ORAL_TABLET | ORAL | Status: AC

## 2012-07-06 MED ORDER — ALBUTEROL SULFATE (5 MG/ML) 0.5% IN NEBU: 2.5000 mg | INHALATION_SOLUTION | RESPIRATORY_TRACT | Status: AC | PRN

## 2012-07-06 MED ORDER — ACETAZOLAMIDE 250 MG PO TABS: 250.0000 mg | ORAL_TABLET | Freq: Two times a day (BID) | ORAL | Status: AC

## 2012-07-06 MED ORDER — GUAIFENESIN ER 600 MG PO TB12: 1200.0000 mg | ORAL_TABLET | Freq: Two times a day (BID) | ORAL | Status: AC

## 2012-07-06 MED ORDER — POTASSIUM CHLORIDE CRYS ER 20 MEQ PO TBCR
20.0000 meq | EXTENDED_RELEASE_TABLET | Freq: Two times a day (BID) | ORAL | Status: DC
  Administered 2012-07-06: 20 meq via ORAL
  Filled 2012-07-06: qty 1

## 2012-07-06 MED ORDER — POTASSIUM CHLORIDE CRYS ER 20 MEQ PO TBCR: 20.0000 meq | EXTENDED_RELEASE_TABLET | Freq: Two times a day (BID) | ORAL | Status: AC

## 2012-07-06 MED ORDER — PREDNISONE 50 MG PO TABS
50.0000 mg | ORAL_TABLET | Freq: Every day | ORAL | Status: DC
  Filled 2012-07-06: qty 1

## 2012-07-06 NOTE — Clinical Social Work Psychosocial (Signed)
     Clinical Social Work Department BRIEF PSYCHOSOCIAL ASSESSMENT 07/06/2012  Patient:  Bridget Duncan, Bridget Duncan     Account Number:  [id number]     Admit date:  07/02/2012  Clinical Social Worker:  Tiburcio Pea  Date/Time:  07/06/2012 04:42 PM  Referred by:  Physician  Date Referred:  07/05/2012 Referred for  SNF Placement   Other Referral:   Interview type:  Other - See comment Other interview type:   patient and daughter    PSYCHOSOCIAL DATA Living Status:  WITH ADULT CHILDREN Admitted from facility:   Level of care:   Primary support name:  Bridget Duncan   [phone]  1 Primary support relationship to patient:  CHILD, ADULT Degree of support available:   Strong support    CURRENT CONCERNS Current Concerns  Post-Acute Placement   Other Concerns:    SOCIAL WORK ASSESSMENT / PLAN 79 year old female admitted from home where she lived with her daughter and the d/c plan was for return home.  This a.m CSW was notified that patient woudl require SNF placement. Met with patient and talked with daughter Bridget Duncan via phone- they want [facility] of [address] Dr. Kevan Ny has contacted facility to arrange admission. Fl2 completed and sent to MD to sign. Ok per MD for d/c today to SNF.   Assessment/plan status:  Psychosocial Support/Ongoing Assessment of Needs Other assessment/ plan:   Information/referral to community resources:   SNF list offered but deferred as pt chose [facility]    PATIENTS/FAMILYS RESPONSE TO PLAN OF CARE: Patient is alert and oriented; she is very hard of hearing. She wants to go to [facility] for short term SNF- then back home. Daughter agrees. Bed offer in place and patient d/c'd to SNF . Patent and daughter were very pleased. She has been a resident of [facility] multiple times in the past.  No further CSW intervention indicated.

## 2012-07-06 NOTE — Progress Notes (Signed)
Patient transferred/transported by stretcher via ambulance to [facility]. Nursing Report given to nurse Asher Muir and will take over care of patient once she arrives. Nurse Zykera Abella signing off at this time.

## 2012-07-06 NOTE — Discharge Summary (Addendum)
Physician Discharge Summary  NAME:Bridget Duncan  ZOX:[id number]  DOB: 1925/10/25   Admit date: 07/02/2012 Discharge date: 07/06/2012  Discharge Diagnoses:  Principal Problem:  COPD exacerbation - symptomatically improving.  Switching to oral prednisone today Active Problems:  Infected prosthesis of right hip - wound is healed, continue rifampin and doxycycline  Chronic respiratory failure - chronic O2  UC (ulcerative colitis) - symptomatically improved on higher dose steroids  Chronic CHF - controlled  Pulmonary infiltrate - CHF versus pneumonia - symptomatically improved  Acute and chronic respiratory failure (acute-on-chronic) - improved  Left lower lobe infiltrate - resolved   Discharge Physical Exam:   General Appearance: Alert, cooperative, persistent congested cough but improved  Weight change:   Intake/Output Summary (Last 24 hours) at 07/06/12 0739 Last data filed at 07/06/12 8119  Gross per 24 hour  Intake    726 ml  Output    500 ml  Net    226 ml   Filed Vitals:   07/05/12 1642 07/05/12 1947 07/05/12 2138 07/06/12 0555  BP: 112/71  114/51 125/57  Pulse: 62  74 74  Temp: 97.8 F (36.6 C)  97.8 F (36.6 C) 97.9 F (36.6 C)  TempSrc: Oral  Oral Oral  Resp: 18  18 19   Height: 5' (1.524 m)     Weight: 60.4 kg (133 lb 2.5 oz)   59.194 kg (130 lb 8 oz)  SpO2: 95% 91% 91% 92%    Lungs: Distant breath sounds with faint end expiratory wheeze and rhonchi with cough, improved since admission Heart: Regular rate and rhythm, S1 and S2 normal, no murmur, rub or gallop Abdomen: Soft, non-tender, bowel sounds active all four quadrants, no masses, no organomegaly Extremities: Extremities normal, atraumatic, no cyanosis or edema Neuro: Alert and oriented, nonfocal  Discharge Condition: Improved but guarded  Hospital Course: 79 year old female with chronic respiratory failure he was a DNR/DNI and on chronic home oxygen presented with worsening chest congestion and  severe hypoxemia.  Responded well to high dose intravenous steroids and is transitioning over to oral steroids today.Marland Kitchen  Ulcerative colitis, chronic right hip infection, and CHF under good control.  We'll transfer to [facility] nursing facility today for further followup  Things to follow up in the outpatient setting: Hypoxemia and chest congestion  Consults: Treatment Team:  Marden Noble, MD  Disposition: 03-Skilled Nursing Facility  Discharge Orders    Future Orders Please Complete By Expires   Diet - low sodium heart healthy      Increase activity slowly      Discharge instructions      Comments:   Call M.D. for increased shortness of breath or hypoxia   Call MD for:  temperature >100.4      Call MD for:  difficulty breathing, headache or visual disturbances      (HEART FAILURE PATIENTS) Call MD:  Anytime you have any of the following symptoms: 1) 3 pound weight gain in 24 hours or 5 pounds in 1 week 2) shortness of breath, with or without a dry hacking cough 3) swelling in the hands, feet or stomach 4) if you have to sleep on extra pillows at night in order to breathe.          Medication List     As of 07/06/2012  7:39 AM    STOP taking these medications         albuterol (2.5 MG/3ML) 0.083% nebulizer solution   Commonly known as: PROVENTIL      albuterol  108 (90 BASE) MCG/ACT inhaler   Commonly known as: PROVENTIL HFA;VENTOLIN HFA      diclofenac 1.3 % Ptch   Commonly known as: FLECTOR      diclofenac sodium 1 % Gel   Commonly known as: VOLTAREN      famotidine 20 MG tablet   Commonly known as: PEPCID      furosemide 20 MG tablet   Commonly known as: LASIX      TAKE these medications         acetaminophen 500 MG tablet   Commonly known as: TYLENOL   Take 500 mg by mouth every 6 (six) hours as needed.      acetaZOLAMIDE 250 MG tablet   Commonly known as: DIAMOX   Take 1 tablet (250 mg total) by mouth 2 (two) times daily.      albuterol (5 MG/ML) 0.5% nebulizer  solution   Commonly known as: PROVENTIL   Take 0.5 mLs (2.5 mg total) by nebulization every 2 (two) hours as needed for wheezing.      ALIGN 4 MG Caps   Take 1 capsule by mouth daily.      arformoterol 15 MCG/2ML Nebu   Commonly known as: BROVANA   Take 15 mcg by nebulization 2 (two) times daily.      atorvastatin 10 MG tablet   Commonly known as: LIPITOR   Take 5 mg by mouth 3 (three) times a week. On Monday, Wednesday, and Friday.      calcium-vitamin D 500-200 MG-UNIT per tablet   Commonly known as: OSCAL WITH D   Take 1 tablet by mouth daily.      cholecalciferol 400 UNITS Tabs   Commonly known as: VITAMIN D   Take 400 Units by mouth daily.      dorzolamide 2 % ophthalmic solution   Commonly known as: TRUSOPT   Place 1 drop into both eyes 2 (two) times daily.      doxycycline 100 MG tablet   Commonly known as: VIBRA-TABS   Take 100 mg by mouth 2 (two) times daily.      guaiFENesin 600 MG 12 hr tablet   Commonly known as: MUCINEX   Take 2 tablets (1,200 mg total) by mouth 2 (two) times daily.      HYDROcodone-acetaminophen 5-325 MG per tablet   Commonly known as: NORCO/VICODIN   Take 1 tablet by mouth 4 (four) times daily. For pain      ipratropium 0.03 % nasal spray   Commonly known as: ATROVENT   Place 2 sprays into the nose every 12 (twelve) hours.      latanoprost 0.005 % ophthalmic solution   Commonly known as: XALATAN   Place 1 drop into both eyes at bedtime.      lidocaine 5 %   Commonly known as: LIDODERM   Place 1 patch onto the skin daily. Remove & Discard patch within 12 hours or as directed by MD      magnesium oxide 400 MG tablet   Commonly known as: MAG-OX   Take 400 mg by mouth daily.      meloxicam 7.5 MG tablet   Commonly known as: MOBIC   Take 7.5 mg by mouth 2 (two) times daily.      mesalamine 400 MG EC tablet   Commonly known as: ASACOL   Take 800 mg by mouth 3 (three) times daily.      metoprolol tartrate 25 MG tablet   Commonly  known as:  LOPRESSOR   Take 25 mg by mouth 2 (two) times daily.      pantoprazole 40 MG tablet   Commonly known as: PROTONIX   Take 40 mg by mouth daily.      potassium chloride SA 20 MEQ tablet   Commonly known as: K-DUR,KLOR-CON   Take 1 tablet (20 mEq total) by mouth 2 (two) times daily.      predniSONE 50 MG tablet   Commonly known as: DELTASONE   1 tablet orally every morning      rifampin 300 MG capsule   Commonly known as: RIFADIN   Take 300 mg by mouth 2 (two) times daily.      sucralfate 1 G tablet   Commonly known as: CARAFATE   Take 1 g by mouth 4 (four) times daily.      tiotropium 18 MCG inhalation capsule   Commonly known as: SPIRIVA   Place 18 mcg into inhaler and inhale daily.      vitamin C 500 MG tablet   Commonly known as: ASCORBIC ACID   Take 500 mg by mouth daily.      zolpidem 5 MG tablet   Commonly known as: AMBIEN   Take 5 mg by mouth at bedtime.         The results of significant diagnostics from this hospitalization (including imaging, microbiology, ancillary and laboratory) are listed below for reference.    Significant Diagnostic Studies: Dg Chest Port 1 View  07/05/2012  *RADIOLOGY REPORT*  Clinical Data: COPD with left lower lobe infiltrate.  PORTABLE CHEST - 1 VIEW  Comparison: One-view chest 07/02/2012.  Findings: Low lung volumes exaggerate the heart size. Atherosclerotic calcifications are present within the aorta.  Mild interstitial prominence is chronic.  The left lower lobe airspace disease has improved.  Minimal bibasilar atelectasis persists.  IMPRESSION:  1.  Low lung volumes with minimal bibasilar atelectasis. 2.  Mild interstitial prominence is chronic.   Original Report Authenticated By: Marin Roberts, M.D.    Dg Chest Portable 1 View  07/02/2012  *RADIOLOGY REPORT*  Clinical Data: Shortness of breath.  Increased sputum production.  PORTABLE CHEST - 1 VIEW  Comparison: 04/17/2012  Findings: The shallow inspiration.   Borderline heart size with normal pulmonary vascularity.  There is increased density in the right paratracheal region which is stable since the previous study. Calcified and tortuous aorta.  Interval development of slight fibrosis or more likely linear atelectasis in the left lung base. Otherwise, there is no significant change.  IMPRESSION: Stable appearance of right paratracheal soft tissues.  Interval development of atelectasis or infiltration in the left lung base with shallow inspiration.   Original Report Authenticated By: Burman Nieves, M.D.     Microbiology: Recent Results (from the past 240 hour(s))  MRSA PCR SCREENING     Status: Normal   Collection Time   07/05/12 11:05 AM      Component Value Range Status Comment   MRSA by PCR NEGATIVE  NEGATIVE Final      Labs: Results for orders placed during the hospital encounter of 07/02/12  CBC WITH DIFFERENTIAL      Component Value Range   WBC 10.5  4.0 - 10.5 K/uL   RBC 4.27  3.87 - 5.11 MIL/uL   Hemoglobin 11.6 (*) 12.0 - 15.0 g/dL   HCT 14.7  82.9 - 56.2 %   MCV 96.3  78.0 - 100.0 fL   MCH 27.2  26.0 - 34.0 pg   MCHC 28.2 (*)  30.0 - 36.0 g/dL   RDW 14.7  82.9 - 56.2 %   Platelets 328  150 - 400 K/uL   Neutrophils Relative 75  43 - 77 %   Neutro Abs 7.9 (*) 1.7 - 7.7 K/uL   Lymphocytes Relative 18  12 - 46 %   Lymphs Abs 1.9  0.7 - 4.0 K/uL   Monocytes Relative 8  3 - 12 %   Monocytes Absolute 0.8  0.1 - 1.0 K/uL   Eosinophils Relative 0  0 - 5 %   Eosinophils Absolute 0.0  0.0 - 0.7 K/uL   Basophils Relative 0  0 - 1 %   Basophils Absolute 0.0  0.0 - 0.1 K/uL  COMPREHENSIVE METABOLIC PANEL      Component Value Range   Sodium 144  135 - 145 mEq/L   Potassium 3.7  3.5 - 5.1 mEq/L   Chloride 100  96 - 112 mEq/L   CO2 39 (*) 19 - 32 mEq/L   Glucose, Bld 135 (*) 70 - 99 mg/dL   BUN 26 (*) 6 - 23 mg/dL   Creatinine, Ser 1.30  0.50 - 1.10 mg/dL   Calcium 9.7  8.4 - 86.5 mg/dL   Total Protein 6.9  6.0 - 8.3 g/dL   Albumin  2.8 (*) 3.5 - 5.2 g/dL   AST 63 (*) 0 - 37 U/L   ALT 46 (*) 0 - 35 U/L   Alkaline Phosphatase 35 (*) 39 - 117 U/L   Total Bilirubin 0.3  0.3 - 1.2 mg/dL   GFR calc non Af Amer 49 (*) >90 mL/min   GFR calc Af Amer 56 (*) >90 mL/min  TROPONIN I      Component Value Range   Troponin I <0.30  <0.30 ng/mL  TSH      Component Value Range   TSH 0.925  0.350 - 4.500 uIU/mL  URINALYSIS, ROUTINE W REFLEX MICROSCOPIC      Component Value Range   Color, Urine AMBER (*) YELLOW   APPearance TURBID (*) CLEAR   Specific Gravity, Urine 1.021  1.005 - 1.030   pH 8.5 (*) 5.0 - 8.0   Glucose, UA NEGATIVE  NEGATIVE mg/dL   Hgb urine dipstick NEGATIVE  NEGATIVE   Bilirubin Urine NEGATIVE  NEGATIVE   Ketones, ur NEGATIVE  NEGATIVE mg/dL   Protein, ur 30 (*) NEGATIVE mg/dL   Urobilinogen, UA 0.2  0.0 - 1.0 mg/dL   Nitrite NEGATIVE  NEGATIVE   Leukocytes, UA TRACE (*) NEGATIVE  BLOOD GAS, ARTERIAL      Component Value Range   O2 Content 5.0     pH, Arterial 7.384  7.350 - 7.450   pCO2 arterial 68.8 (*) 35.0 - 45.0 mmHg   pO2, Arterial 98.4  80.0 - 100.0 mmHg   Bicarbonate 40.2 (*) 20.0 - 24.0 mEq/L   TCO2 42.3  0 - 100 mmol/L   Acid-Base Excess 14.4 (*) 0.0 - 2.0 mmol/L   O2 Saturation 99.0     Patient temperature 98.6     Collection site RIGHT RADIAL     Drawn by [id number]     Sample type ARTERIAL DRAW     Allens test (pass/fail) PASS  PASS  BASIC METABOLIC PANEL      Component Value Range   Sodium 144  135 - 145 mEq/L   Potassium 3.7  3.5 - 5.1 mEq/L   Chloride 98  96 - 112 mEq/L   CO2 42 (*) 19 - 32  mEq/L   Glucose, Bld 112 (*) 70 - 99 mg/dL   BUN 24 (*) 6 - 23 mg/dL   Creatinine, Ser 1.61  0.50 - 1.10 mg/dL   Calcium 9.1  8.4 - 09.6 mg/dL   GFR calc non Af Amer 61 (*) >90 mL/min   GFR calc Af Amer 71 (*) >90 mL/min  CBC      Component Value Range   WBC 7.1  4.0 - 10.5 K/uL   RBC 3.77 (*) 3.87 - 5.11 MIL/uL   Hemoglobin 10.4 (*) 12.0 - 15.0 g/dL   HCT 04.5 (*) 40.9 - 81.1 %   MCV  94.2  78.0 - 100.0 fL   MCH 27.6  26.0 - 34.0 pg   MCHC 29.3 (*) 30.0 - 36.0 g/dL   RDW 91.4 (*) 78.2 - 95.6 %   Platelets 279  150 - 400 K/uL  BASIC METABOLIC PANEL      Component Value Range   Sodium 140  135 - 145 mEq/L   Potassium 3.5  3.5 - 5.1 mEq/L   Chloride 98  96 - 112 mEq/L   CO2 36 (*) 19 - 32 mEq/L   Glucose, Bld 87  70 - 99 mg/dL   BUN 24 (*) 6 - 23 mg/dL   Creatinine, Ser 2.13  0.50 - 1.10 mg/dL   Calcium 9.3  8.4 - 08.6 mg/dL   GFR calc non Af Amer 64 (*) >90 mL/min   GFR calc Af Amer 74 (*) >90 mL/min  CBC WITH DIFFERENTIAL      Component Value Range   WBC 7.5  4.0 - 10.5 K/uL   RBC 3.62 (*) 3.87 - 5.11 MIL/uL   Hemoglobin 10.0 (*) 12.0 - 15.0 g/dL   HCT 57.8 (*) 46.9 - 62.9 %   MCV 95.0  78.0 - 100.0 fL   MCH 27.6  26.0 - 34.0 pg   MCHC 29.1 (*) 30.0 - 36.0 g/dL   RDW 52.8 (*) 41.3 - 24.4 %   Platelets 254  150 - 400 K/uL   Neutrophils Relative 72  43 - 77 %   Neutro Abs 5.4  1.7 - 7.7 K/uL   Lymphocytes Relative 21  12 - 46 %   Lymphs Abs 1.6  0.7 - 4.0 K/uL   Monocytes Relative 6  3 - 12 %   Monocytes Absolute 0.5  0.1 - 1.0 K/uL   Eosinophils Relative 0  0 - 5 %   Eosinophils Absolute 0.0  0.0 - 0.7 K/uL   Basophils Relative 0  0 - 1 %   Basophils Absolute 0.0  0.0 - 0.1 K/uL  PRO B NATRIURETIC PEPTIDE      Component Value Range   Pro B Natriuretic peptide (BNP) 2612.0 (*) 0 - 450 pg/mL  URINE MICROSCOPIC-ADD ON      Component Value Range   Squamous Epithelial / LPF RARE  RARE   WBC, UA 0-2  <3 WBC/hpf   Bacteria, UA MANY (*) RARE  BASIC METABOLIC PANEL      Component Value Range   Sodium 141  135 - 145 mEq/L   Potassium 3.6  3.5 - 5.1 mEq/L   Chloride 101  96 - 112 mEq/L   CO2 33 (*) 19 - 32 mEq/L   Glucose, Bld 85  70 - 99 mg/dL   BUN 21  6 - 23 mg/dL   Creatinine, Ser 0.10  0.50 - 1.10 mg/dL   Calcium 9.2  8.4 - 27.2 mg/dL  GFR calc non Af Amer 75 (*) >90 mL/min   GFR calc Af Amer 87 (*) >90 mL/min  PRO B NATRIURETIC PEPTIDE       Component Value Range   Pro B Natriuretic peptide (BNP) 2597.0 (*) 0 - 450 pg/mL  MRSA PCR SCREENING      Component Value Range   MRSA by PCR NEGATIVE  NEGATIVE  BASIC METABOLIC PANEL      Component Value Range   Sodium 140  135 - 145 mEq/L   Potassium 3.2 (*) 3.5 - 5.1 mEq/L   Chloride 101  96 - 112 mEq/L   CO2 32  19 - 32 mEq/L   Glucose, Bld 72  70 - 99 mg/dL   BUN 17  6 - 23 mg/dL   Creatinine, Ser 9.56  0.50 - 1.10 mg/dL   Calcium 9.3  8.4 - 21.3 mg/dL   GFR calc non Af Amer 77 (*) >90 mL/min   GFR calc Af Amer 89 (*) >90 mL/min    Time coordinating discharge: 40 minutes  Signed: Pearla Dubonnet, MD 07/06/2012, 7:39 AM

## 2012-07-06 NOTE — Progress Notes (Signed)
Physical Therapy Treatment Patient Details Name: Bridget Duncan MRN: [id number] DOB: [birth year] Today's Date: 07/06/2012 Time: 7829-5621 PT Time Calculation (min): 25 min  PT Assessment / Plan / Recommendation Comments on Treatment Session  Pt admitted with COPD exacerbation with improved mobility today. Per SW who spoke with dgtr 21hr assist available with at least 3hrs/day that she is unassisted and per family and MD request ST-SNF which is appropriate given the need for assist with mobility and transfers at all times. Will continue to follow acutely to maximize mobility and gait and continue to recommend OOB daily with nursing as well as use of bedside commode not bed pan.     Follow Up Recommendations  SNF     Does the patient have the potential to tolerate intense rehabilitation     Barriers to Discharge        Equipment Recommendations       Recommendations for Other Services    Frequency     Plan Discharge plan needs to be updated;Frequency remains appropriate    Precautions / Restrictions Precautions Precautions: Fall Precaution Comments: pt with limited R hip and knee ROM Restrictions Weight Bearing Restrictions: No   Pertinent Vitals/Pain sats 91% EOB on 2L with HR 76 4L for ambulation with sats 94% and HR 90 with in room ambulation No paain   Mobility  Bed Mobility Bed Mobility: Supine to Sit Supine to Sit: HOB flat;With rails;4: Min guard Details for Bed Mobility Assistance: cueing for sequence but no physical assist required Transfers Sit to Stand: 2: Max assist;4: Min guard;From bed;From toilet Stand to Sit: 4: Min assist;To chair/3-in-1;To toilet Details for Transfer Assistance: Pt with increased ability to stand from bed only requiring guarding but standing from low commode without rails pt required max assist as needing to void urgently and unable to place [facility] over toilet prior to use. Assist to control descent to both surfaces with cueing for hand  placement Ambulation/Gait Ambulation/Gait Assistance: 4: Min guard Ambulation Distance (Feet): 18 Feet (x 2 trials) Assistive device: Rolling walker Ambulation/Gait Assistance Details: pt maintains flexed posture with cueing to step into RW, decreased knee flexion RLE Gait Pattern: Trunk flexed;Step-to pattern;Decreased hip/knee flexion - right Gait velocity: decreased Stairs: No    Exercises General Exercises - Lower Extremity Long Arc Quad: AROM;Left;10 reps;Seated Hip Flexion/Marching: AROM;Both;10 reps;Seated (decreased hip flexion Right vs Left )   PT Diagnosis:    PT Problem List:   PT Treatment Interventions:     PT Goals Acute Rehab PT Goals PT Goal: Supine/Side to Sit - Progress: Progressing toward goal PT Goal: Sit to Stand - Progress: Progressing toward goal PT Goal: Stand to Sit - Progress: Progressing toward goal PT Goal: Ambulate - Progress: Progressing toward goal  Visit Information  Last PT Received On: 07/06/12 Assistance Needed: +1    Subjective Data  Subjective: "I didn't decide my doctor did"- regarding decision for ST-SNF   Cognition  Overall Cognitive Status: Appears within functional limits for tasks assessed/performed Arousal/Alertness: Awake/alert Orientation Level: Appears intact for tasks assessed Behavior During Session: [facility] for tasks performed    Balance     End of Session PT - End of Session Equipment Utilized During Treatment: Gait belt Activity Tolerance: Patient tolerated treatment well Patient left: in chair;with call bell/phone within reach   GP     Delorse Lek 07/06/2012, 1:12 PM Delaney Meigs, PT [phone]

## 2012-07-06 NOTE — Progress Notes (Signed)
Notified attending MD that patient had 5 beats of V-Tach. Patient sitting up in chair and is resting. Asked patient and she stated she is ok and not in pain when asked. Patient asymptomatic in regards to irregular arrythmia. No new orders given, will continue to monitor to end of shift or until discharge.

## 2012-07-06 NOTE — Progress Notes (Signed)
Bridget Duncan to be D/C'd Skilled nursing facility per MD order.  Discussed with the patient and all questions fully answered.   Bridget Duncan, Bridget Duncan  Home Medication Instructions [id number]   Printed on:07/06/12 1648  Medication Information                    arformoterol (BROVANA) 15 MCG/2ML NEBU Take 15 mcg by nebulization 2 (two) times daily.             calcium-vitamin D (OSCAL WITH D) 500-200 MG-UNIT per tablet Take 1 tablet by mouth daily.             lidocaine (LIDODERM) 5 % Place 1 patch onto the skin daily. Remove & Discard patch within 12 hours or as directed by MD            latanoprost (XALATAN) 0.005 % ophthalmic solution Place 1 drop into both eyes at bedtime.            mesalamine (ASACOL) 400 MG EC tablet Take 800 mg by mouth 3 (three) times daily.            pantoprazole (PROTONIX) 40 MG tablet Take 40 mg by mouth daily.           meloxicam (MOBIC) 7.5 MG tablet Take 7.5 mg by mouth 2 (two) times daily.           Probiotic Product (ALIGN) 4 MG CAPS Take 1 capsule by mouth daily.           zolpidem (AMBIEN) 5 MG tablet Take 5 mg by mouth at bedtime.           atorvastatin (LIPITOR) 10 MG tablet Take 5 mg by mouth 3 (three) times a week. On Monday, Wednesday, and Friday.           tiotropium (SPIRIVA) 18 MCG inhalation capsule Place 18 mcg into inhaler and inhale daily.           cholecalciferol (VITAMIN D) 400 UNITS TABS Take 400 Units by mouth daily.           vitamin C (ASCORBIC ACID) 500 MG tablet Take 500 mg by mouth daily.           doxycycline (VIBRA-TABS) 100 MG tablet Take 100 mg by mouth 2 (two) times daily.           dorzolamide (TRUSOPT) 2 % ophthalmic solution Place 1 drop into both eyes 2 (two) times daily.           HYDROcodone-acetaminophen (NORCO) 5-325 MG per tablet Take 1 tablet by mouth 4 (four) times daily. For pain           ipratropium (ATROVENT) 0.03 % nasal spray Place 2 sprays into the nose every 12 (twelve)  hours.           sucralfate (CARAFATE) 1 G tablet Take 1 g by mouth 4 (four) times daily.           rifampin (RIFADIN) 300 MG capsule Take 300 mg by mouth 2 (two) times daily.           metoprolol tartrate (LOPRESSOR) 25 MG tablet Take 25 mg by mouth 2 (two) times daily.           magnesium oxide (MAG-OX) 400 MG tablet Take 400 mg by mouth daily.           acetaminophen (TYLENOL) 500 MG tablet Take 500 mg by mouth every 6 (six) hours as needed.  acetaZOLAMIDE (DIAMOX) 250 MG tablet Take 1 tablet (250 mg total) by mouth 2 (two) times daily.           albuterol (PROVENTIL) (5 MG/ML) 0.5% nebulizer solution Take 0.5 mLs (2.5 mg total) by nebulization every 2 (two) hours as needed for wheezing.           guaiFENesin (MUCINEX) 600 MG 12 hr tablet Take 2 tablets (1,200 mg total) by mouth 2 (two) times daily.           potassium chloride SA (K-DUR,KLOR-CON) 20 MEQ tablet Take 1 tablet (20 mEq total) by mouth 2 (two) times daily.           predniSONE (DELTASONE) 50 MG tablet 1 tablet orally every morning             VVS, Skin clean, dry and intact without evidence of skin break down, no evidence of skin tears noted. IV catheter discontinued intact. Site without signs and symptoms of complications. Dressing and pressure applied.   Patient escorted via stretcher, and D/C to [facility].  Bridget Duncan 07/06/2012 4:48 PM

## 2013-06-20 DEATH — deceased

## 2013-07-08 ENCOUNTER — Telehealth: Payer: Self-pay

## 2013-07-08 NOTE — Telephone Encounter (Signed)
Patient past away per Obituary in [place] [employer] °

## 2014-01-20 IMAGING — CR DG CHEST 1V
1 series · 1 of 1 positions shown · non-contrast
Comparison: 01/21/2010

CLINICAL DATA: Preop right leg infection

CHEST - 1 VIEW

[view not recorded]
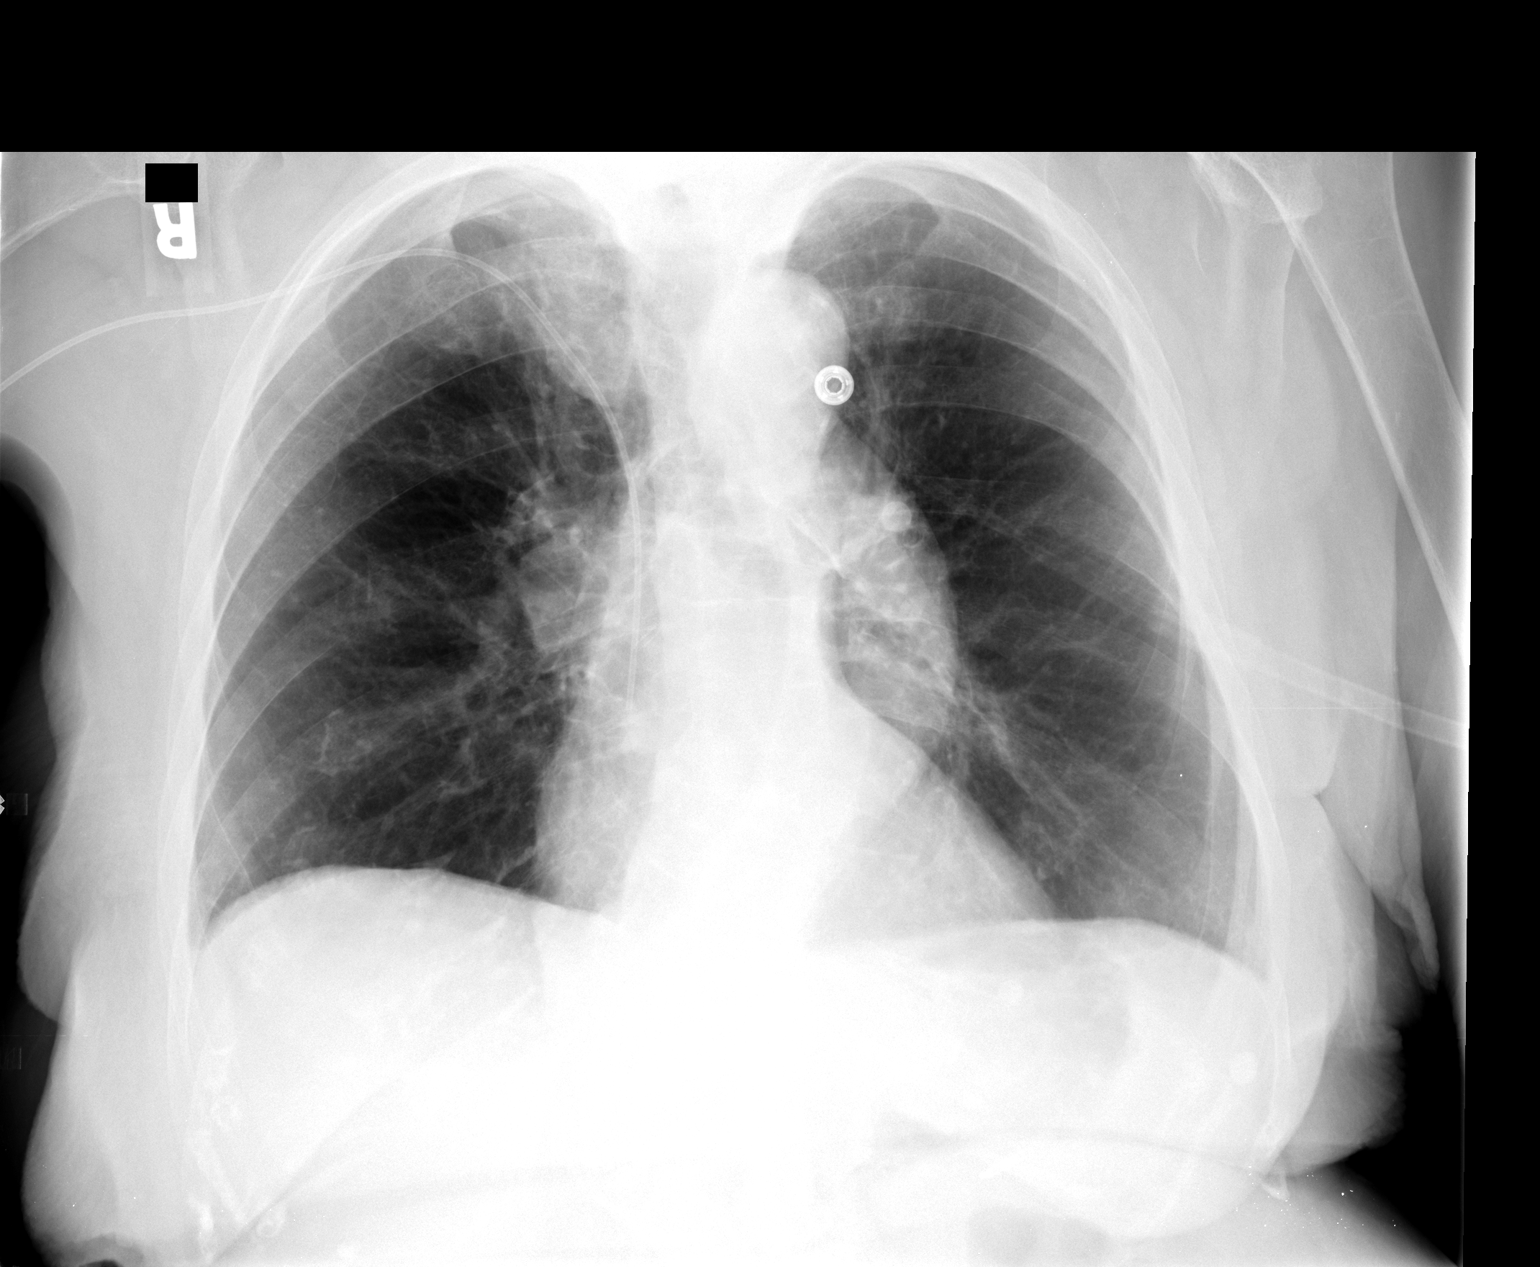

[1 of 1 positions shown; findings below may reference images not displayed]

FINDINGS: Chronic interstitial markings. No pleural effusion or
pneumothorax.

Cardiomediastinal silhouette is within normal limits.

The right subclavian PICC with its tip at the cavoatrial junction.
IMPRESSION: No evidence of acute cardiopulmonary disease.
